# Patient Record
Sex: Female | Born: 1935 | Race: White | Hispanic: No | Smoking: Former smoker
Health system: Southern US, Community
[De-identification: ages and names within clinical notes are randomized; demographics above are authoritative.]

## PROBLEM LIST (undated history)

## (undated) DIAGNOSIS — H409 Unspecified glaucoma: Secondary | ICD-10-CM

## (undated) DIAGNOSIS — G4700 Insomnia, unspecified: Secondary | ICD-10-CM

## (undated) DIAGNOSIS — I10 Essential (primary) hypertension: Secondary | ICD-10-CM

## (undated) DIAGNOSIS — C189 Malignant neoplasm of colon, unspecified: Secondary | ICD-10-CM

---

## 2015-04-08 ENCOUNTER — Ambulatory Visit: Payer: Medicare Other | Admitting: Podiatry

## 2015-04-22 ENCOUNTER — Ambulatory Visit (INDEPENDENT_AMBULATORY_CARE_PROVIDER_SITE_OTHER): Payer: Medicare Other

## 2015-04-22 ENCOUNTER — Ambulatory Visit (INDEPENDENT_AMBULATORY_CARE_PROVIDER_SITE_OTHER): Payer: Medicare Other | Admitting: Podiatry

## 2015-04-22 ENCOUNTER — Encounter: Payer: Self-pay | Admitting: Podiatry

## 2015-04-22 VITALS — BP 135/62 | HR 74 | Resp 16 | Ht 60.0 in | Wt 100.0 lb

## 2015-04-22 DIAGNOSIS — M2042 Other hammer toe(s) (acquired), left foot: Secondary | ICD-10-CM | POA: Diagnosis not present

## 2015-04-22 NOTE — Progress Notes (Signed)
   Subjective:    Patient ID: Katie Leonard, female    DOB: 03/05/1925, 81 y.o.   MRN: [id number]  HPI Comments: "I have a problem with this toe"  Patient c/o tender 2nd toe left for several years. Worsened in the last year. The toe sits on top of the big toe. The toe is red at the knuckle. Has problems with shoes rubbing. She tries to keep taped down for comfort.  Toe Pain       Review of Systems  HENT: Positive for hearing loss.   Eyes: Positive for visual disturbance.  Neurological: Positive for dizziness.  Hematological: Bruises/bleeds easily.  All other systems reviewed and are negative.      Objective:   Physical Exam: I have reviewed her past medical history medications allergy surgery social history and review of systems. Pulses are strongly palpable bilaterally. Neurologic sensorium is intact per Semmes-Weinstein monofilament. Deep tendon reflexes are intact bilateral and muscle strength +5 over 5 dorsiflexion plantar flexors and inverters and everters all musculature is intact. Orthopedic evaluation demonstrates all joints distal to the ankle full range of motion without crepitation. Hallux valgus of the left foot with complete dislocation of the second digit of the left foot with hammertoe deformity of second metatarsophalangeal joint is also noted. This is very tender on range of motion and palpation. Radiographs confirm severe osteoarthritic changes worse metatarsophalangeal joint as well as hammertoe deformity with dislocation of the second toe at the MTPJ left.        Assessment & Plan:  Assessment: Severe hammertoe deformity with chronic pain second digit left foot.  Plan: We discussed the etiology pathology conservative versus surgical therapies. At this point we did discuss disarticulation of the second toe of the second metatarsophalangeal joint of the left foot. We went over the consent form today By lying number by number giving her him time to ask questions she saw  fit regarding this planned procedure. I answered all the questions regarding this procedure to the best of my ability in layman's terms. She understood it was amenable to it and signed all 3 pages of the consent form. We did discuss the possible postop complications which may include but are not limited to postoperative pain bleeding swelling infection recurrence of pain relief for further surgery loss of limb and loss of life.

## 2015-04-22 NOTE — Patient Instructions (Signed)
Pre-Operative Instructions  Congratulations, you have decided to take an important step to improving your quality of life.  You can be assured that the doctors of [facility] will be with you every step of the way.  1. Plan to be at the surgery center/hospital at least 1 (one) hour prior to your scheduled time unless otherwise directed by the surgical center/hospital staff.  You must have a responsible adult accompany you, remain during the surgery and drive you home.  Make sure you have directions to the surgical center/hospital and know how to get there on time. 2. For hospital based surgery you will need to obtain a history and physical form from your family physician within 1 month prior to the date of surgery- we will give you a form for you primary physician.  3. We make every effort to accommodate the date you request for surgery.  There are however, times where surgery dates or times have to be moved.  We will contact you as soon as possible if a change in schedule is required.   4. No Aspirin/Ibuprofen for one week before surgery.  If you are on aspirin, any non-steroidal anti-inflammatory medications (Mobic, Aleve, Ibuprofen) you should stop taking it 7 days prior to your surgery.  You make take Tylenol  For pain prior to surgery.  5. Medications- If you are taking daily heart and blood pressure medications, seizure, reflux, allergy, asthma, anxiety, pain or diabetes medications, make sure the surgery center/hospital is aware before the day of surgery so they may notify you which medications to take or avoid the day of surgery. 6. No food or drink after midnight the night before surgery unless directed otherwise by surgical center/hospital staff. 7. No alcoholic beverages 24 hours prior to surgery.  No smoking 24 hours prior to or 24 hours after surgery. 8. Wear loose pants or shorts- loose enough to fit over bandages, boots, and casts. 9. No slip on shoes, sneakers are best. 10. Bring  your boot with you to the surgery center/hospital.  Also bring crutches or a walker if your physician has prescribed it for you.  If you do not have this equipment, it will be provided for you after surgery. 11. If you have not been contracted by the surgery center/hospital by the day before your surgery, call to confirm the date and time of your surgery. 12. Leave-time from work may vary depending on the type of surgery you have.  Appropriate arrangements should be made prior to surgery with your employer. 13. Prescriptions will be provided immediately following surgery by your doctor.  Have these filled as soon as possible after surgery and take the medication as directed. 14. Remove nail polish on the operative foot. 15. Wash the night before surgery.  The night before surgery wash the foot and leg well with the antibacterial soap provided and water paying special attention to beneath the toenails and in between the toes.  Rinse thoroughly with water and dry well with a towel.  Perform this wash unless told not to do so by your physician.  Enclosed: 1 Ice pack (please put in freezer the night before surgery)   1 Hibiclens skin cleaner   Pre-op Instructions  If you have any questions regarding the instructions, do not hesitate to call our office.  [place]: [address] [place], [place] [address] [phone]  [place]: [address] [place], [place] [address] [phone]  [place]: [address]  [place],  [address] [phone]  Dr. Richard   Tuchman DPM, Dr. Norman Regal DPM Dr. Richard Sikora DPM, Dr. M. Todd Hyatt DPM, Dr. Kathryn Egerton DPM 

## 2015-05-16 ENCOUNTER — Telehealth: Payer: Self-pay | Admitting: *Deleted

## 2015-05-16 NOTE — Telephone Encounter (Signed)
"  I'm calling to schedule surgery with Dr. Milinda Pointer."  Have you signed consent forms?  "Yes I have.  Can I schedule for 05/23/2015?"  He doesn't have anything on the third.  "I can't do it on the 10th due to transportation issues.  How about the seventeenth?"  Yes, he can do it on the seventeenth.  "Can you possibly put me down on a waiting list for 05/23/2015 just in case there's a cancellation for that day?"  I will put you down on a waiting list.

## 2015-06-05 ENCOUNTER — Other Ambulatory Visit: Payer: Self-pay | Admitting: Podiatry

## 2015-06-05 MED ORDER — CEPHALEXIN 500 MG PO CAPS: 500.0000 mg | ORAL_CAPSULE | Freq: Three times a day (TID) | ORAL | Status: DC

## 2015-06-05 MED ORDER — HYDROCODONE-ACETAMINOPHEN 5-325 MG PO TABS: 1.0000 | ORAL_TABLET | Freq: Four times a day (QID) | ORAL | Status: DC | PRN

## 2015-06-05 MED ORDER — PROMETHAZINE HCL 25 MG PO TABS: 25.0000 mg | ORAL_TABLET | Freq: Three times a day (TID) | ORAL | Status: DC | PRN

## 2015-06-06 ENCOUNTER — Encounter: Payer: Self-pay | Admitting: Podiatry

## 2015-06-06 DIAGNOSIS — M2042 Other hammer toe(s) (acquired), left foot: Secondary | ICD-10-CM | POA: Diagnosis not present

## 2015-06-09 ENCOUNTER — Telehealth: Payer: Self-pay | Admitting: *Deleted

## 2015-06-09 NOTE — Telephone Encounter (Signed)
I called patient to see how she was doing.  "I'm doing excellent, better than what I expected.  I didn't have to take any of the pain medication.  I only took the antibiotic.  Do I still need to only be up on it 15 minutes per hour?"  That's what we recommend for at least the first week.  "Well I do cheat a little and be up on it a little more."  The reason we say that amount of time is because the more you are up on it, it has the chance of swelling and that could lead to pain.  "Oh, I don't want to do anything to do harm to my foot.  I'll make sure I follow directions."  How was your experience at the surgical center.  "I was very pleased at how everything ran.  Dr. Milinda Pointer was excellent.  I really had a good experience."  That's great, if you have any questions or concerns please give [place] a call.

## 2015-06-12 ENCOUNTER — Ambulatory Visit (INDEPENDENT_AMBULATORY_CARE_PROVIDER_SITE_OTHER): Payer: Medicare Other | Admitting: Podiatry

## 2015-06-12 ENCOUNTER — Ambulatory Visit (INDEPENDENT_AMBULATORY_CARE_PROVIDER_SITE_OTHER): Payer: Medicare Other

## 2015-06-12 ENCOUNTER — Encounter: Payer: Self-pay | Admitting: Podiatry

## 2015-06-12 VITALS — BP 123/76 | HR 74 | Resp 16

## 2015-06-12 DIAGNOSIS — Z9889 Other specified postprocedural states: Secondary | ICD-10-CM

## 2015-06-12 DIAGNOSIS — M2042 Other hammer toe(s) (acquired), left foot: Secondary | ICD-10-CM

## 2015-06-12 NOTE — Progress Notes (Signed)
She presents today 1 week status post stent amputation second toe left foot. She denies fever chills nausea vomiting muscle aches and pains that she is doing just great.  Objective: Vital signs are stable she is alert and oriented 3 dry sterile dressing intact was removed reveals disarticulation of the second toe at the level of the second metatarsophalangeal joint incision point appears to be healing quite nicely there is no erythema edema saline as drainage or odor.  Assessment: Well-healing surgical site left foot.  Plan: Redress today address her compressive dressing follow-up with her in 1 week we will consider suture removal at that time.

## 2015-06-13 NOTE — Progress Notes (Signed)
DOS 06/06/2015 Disarticulation 2nd toe left foot at MTPj.

## 2015-06-19 ENCOUNTER — Encounter: Payer: Self-pay | Admitting: Podiatry

## 2015-06-19 ENCOUNTER — Ambulatory Visit (INDEPENDENT_AMBULATORY_CARE_PROVIDER_SITE_OTHER): Payer: Medicare Other | Admitting: Podiatry

## 2015-06-19 DIAGNOSIS — Z9889 Other specified postprocedural states: Secondary | ICD-10-CM

## 2015-06-19 DIAGNOSIS — M2042 Other hammer toe(s) (acquired), left foot: Secondary | ICD-10-CM

## 2015-06-19 NOTE — Progress Notes (Signed)
She presents today 2 weeks status post stent amputation second toe left foot. She denies fever chills nausea vomiting muscle aches and pains that she is doing just great.  Objective: Vital signs are stable she is alert and oriented 3 dry sterile dressing intact was removed incision appears to be healing quite nicely there is no erythema edema drainage or odor.  Assessment: Well-healing surgical site left foot.  Plan: Sutures were removed and light redress today. She can remove the dressing in 1 week and return to regular shoe gear at that time. Will follow-up with her in 1 month.

## 2015-07-17 ENCOUNTER — Encounter: Payer: Medicare Other | Admitting: Podiatry

## 2020-12-17 ENCOUNTER — Encounter (HOSPITAL_BASED_OUTPATIENT_CLINIC_OR_DEPARTMENT_OTHER): Payer: Self-pay | Admitting: *Deleted

## 2020-12-17 ENCOUNTER — Other Ambulatory Visit: Payer: Self-pay

## 2020-12-17 ENCOUNTER — Emergency Department (HOSPITAL_BASED_OUTPATIENT_CLINIC_OR_DEPARTMENT_OTHER): Payer: Medicare Other

## 2020-12-17 DIAGNOSIS — Y92009 Unspecified place in unspecified non-institutional (private) residence as the place of occurrence of the external cause: Secondary | ICD-10-CM | POA: Diagnosis not present

## 2020-12-17 DIAGNOSIS — S0101XA Laceration without foreign body of scalp, initial encounter: Secondary | ICD-10-CM | POA: Diagnosis not present

## 2020-12-17 DIAGNOSIS — M542 Cervicalgia: Secondary | ICD-10-CM | POA: Insufficient documentation

## 2020-12-17 DIAGNOSIS — I10 Essential (primary) hypertension: Secondary | ICD-10-CM | POA: Diagnosis not present

## 2020-12-17 DIAGNOSIS — Z79899 Other long term (current) drug therapy: Secondary | ICD-10-CM | POA: Diagnosis not present

## 2020-12-17 DIAGNOSIS — S0181XA Laceration without foreign body of other part of head, initial encounter: Secondary | ICD-10-CM | POA: Insufficient documentation

## 2020-12-17 DIAGNOSIS — M545 Low back pain, unspecified: Secondary | ICD-10-CM | POA: Insufficient documentation

## 2020-12-17 DIAGNOSIS — S0993XA Unspecified injury of face, initial encounter: Secondary | ICD-10-CM | POA: Diagnosis present

## 2020-12-17 DIAGNOSIS — Z85038 Personal history of other malignant neoplasm of large intestine: Secondary | ICD-10-CM | POA: Diagnosis not present

## 2020-12-17 DIAGNOSIS — Z87891 Personal history of nicotine dependence: Secondary | ICD-10-CM | POA: Insufficient documentation

## 2020-12-17 DIAGNOSIS — S51811A Laceration without foreign body of right forearm, initial encounter: Secondary | ICD-10-CM | POA: Insufficient documentation

## 2020-12-17 DIAGNOSIS — Z7982 Long term (current) use of aspirin: Secondary | ICD-10-CM | POA: Insufficient documentation

## 2020-12-17 DIAGNOSIS — R030 Elevated blood-pressure reading, without diagnosis of hypertension: Secondary | ICD-10-CM | POA: Diagnosis not present

## 2020-12-17 DIAGNOSIS — W01198A Fall on same level from slipping, tripping and stumbling with subsequent striking against other object, initial encounter: Secondary | ICD-10-CM | POA: Diagnosis not present

## 2020-12-17 NOTE — ED Triage Notes (Signed)
C/o fall x 3 hrs ago , lac to posterior head, lac to chin, and multiple skin tears

## 2020-12-18 ENCOUNTER — Emergency Department (HOSPITAL_BASED_OUTPATIENT_CLINIC_OR_DEPARTMENT_OTHER): Payer: Medicare Other

## 2020-12-18 ENCOUNTER — Emergency Department (HOSPITAL_BASED_OUTPATIENT_CLINIC_OR_DEPARTMENT_OTHER)
Admission: EM | Admit: 2020-12-18 | Discharge: 2020-12-18 | Disposition: A | Discharge status: Home / Self Care | Payer: Medicare Other | Attending: Emergency Medicine | Admitting: Emergency Medicine

## 2020-12-18 DIAGNOSIS — S0181XA Laceration without foreign body of other part of head, initial encounter: Secondary | ICD-10-CM | POA: Diagnosis not present

## 2020-12-18 DIAGNOSIS — I10 Essential (primary) hypertension: Secondary | ICD-10-CM

## 2020-12-18 DIAGNOSIS — S51811A Laceration without foreign body of right forearm, initial encounter: Secondary | ICD-10-CM

## 2020-12-18 DIAGNOSIS — S0101XA Laceration without foreign body of scalp, initial encounter: Secondary | ICD-10-CM

## 2020-12-18 DIAGNOSIS — W19XXXA Unspecified fall, initial encounter: Secondary | ICD-10-CM

## 2020-12-18 HISTORY — DX: Unspecified glaucoma: H40.9

## 2020-12-18 HISTORY — DX: Insomnia, unspecified: G47.00

## 2020-12-18 HISTORY — DX: Malignant neoplasm of colon, unspecified: C18.9

## 2020-12-18 HISTORY — DX: Essential (primary) hypertension: I10

## 2020-12-18 MED ORDER — LIDOCAINE-EPINEPHRINE (PF) 2 %-1:200000 IJ SOLN
20.0000 mL | Freq: Once | INTRAMUSCULAR | Status: AC
  Administered 2020-12-18: 20 mL

## 2020-12-18 NOTE — ED Provider Notes (Signed)
[facility] EMERGENCY DEPARTMENT Provider Note   CSN: [id number] Arrival date & time: 12/17/20  2257   History Chief Complaint  Patient presents with  . Fall    Katie Leonard is a 86 y.o. female.  The history is provided by the patient.  Fall  She has history of hypertension and apparently tripped at home and fell causing a laceration to her occiput.  She also had a surgical incision on her chin on the left side which never healed properly and has opened up.  She suffered a skin tear to her right forearm.  She is also complaining of pain in her lower back and in her neck.  She denies any weakness, numbness, tingling.  She states that she is up-to-date on tetanus immunizations.  Past Medical History:  Diagnosis Date  . Colon cancer (HCC)   . Glaucoma   . Hypertension   . Insomnia     There are no problems to display for this patient.   History reviewed. No pertinent surgical history.   OB History   No obstetric history on file.     No family history on file.  Social History   Tobacco Use  . Smoking status: Former Smoker    Quit date: 04/21/1978    Years since quitting: 42.6    Home Medications Prior to Admission medications   Medication Sig Start Date End Date Taking? Authorizing Provider  alendronate (FOSAMAX) 70 MG tablet Take 70 mg by mouth once a week. Take with a full glass of water on an empty stomach.    [provider]  amLODipine (NORVASC) 5 MG tablet Take 5 mg by mouth daily.    [provider]  aspirin 81 MG tablet Take 81 mg by mouth daily.    [provider]  Calcium Carbonate (CALTRATE 600 PO) Take by mouth.    [provider]  Cholecalciferol (VITAMIN D PO) Take by mouth.    [provider]  cycloSPORINE (RESTASIS) 0.05 % ophthalmic emulsion 1 drop 2 (two) times daily.    [provider]  Multiple Vitamin (MULTIVITAMIN) capsule Take 1 capsule by mouth daily.    [provider]   Multiple Vitamins-Minerals (PRESERVISION AREDS 2 PO) Take by mouth.    [provider]  promethazine (PHENERGAN) 25 MG tablet Take 1 tablet (25 mg total) by mouth every 8 (eight) hours as needed for nausea or vomiting. 06/05/15   Hyatt, Max T, DPM  ramipril (ALTACE) 5 MG capsule Take 5 mg by mouth daily.    [provider]  travoprost, benzalkonium, (TRAVATAN) 0.004 % ophthalmic solution 1 drop at bedtime.    [provider]  zolpidem (AMBIEN) 10 MG tablet Take 10 mg by mouth at bedtime as needed for sleep.    [provider]    Allergies    Erythromycin  Review of Systems   Review of Systems  All other systems reviewed and are negative.   Physical Exam Updated Vital Signs BP (!) 192/88   Pulse 84   Temp 98 F (36.7 C)   Resp 17   Ht 5' (1.524 m)   Wt 40.8 kg   SpO2 97%   BMI 17.58 kg/m   Physical Exam Vitals and nursing note reviewed.   86 year old female, resting comfortably and in no acute distress. Vital signs are significant for elevated blood pressure. Oxygen saturation is 97%, which is normal. Head is normocephalic: Laceration is present on the occiput and on  the left side of the chin. PERRLA, EOMI. Oropharynx is clear. Neck is mildly tender in the midline.  There is no adenopathy or JVD. Back is mildly tender in the mid and upper lumbar area.  Moderate kyphoscoliosis is present.  There is no CVA tenderness. Lungs are clear without rales, wheezes, or rhonchi. Chest is nontender. Heart has regular rate and rhythm without murmur. Abdomen is soft, flat, nontender without masses or hepatosplenomegaly and peristalsis is normoactive. Extremities have no cyanosis or edema, full range of motion is present.  Skin tear noted dorsum of right forearm. Skin is warm and dry without rash. Neurologic: Mental status is normal, cranial nerves are intact, there are no motor or sensory deficits.   ED Results / Procedures / Treatments   Labs (all  labs ordered are listed, but only abnormal results are displayed) Labs Reviewed - No data to display  EKG None  Radiology CT Head Wo Contrast  Result Date: 12/17/2020 CLINICAL DATA:  86 year old post fall with laceration to posterior head. EXAM: CT HEAD WITHOUT CONTRAST TECHNIQUE: Contiguous axial images were obtained from the base of the skull through the vertex without intravenous contrast. COMPARISON:  None. FINDINGS: Brain: Age related atrophy with mild chronic small vessel ischemia. No intracranial hemorrhage, mass effect, or midline shift. No hydrocephalus. The basilar cisterns are patent. No evidence of territorial infarct or acute ischemia. No extra-axial or intracranial fluid collection. Vascular: No hyperdense vessel. Skull: No fracture or focal lesion. Sinuses/Orbits: Complete opacification of the right maxillary sinus with cortical thickening. Remaining paranasal sinuses are clear. Other: Right parietal scalp hematoma and laceration. IMPRESSION: 1. Right parietal scalp hematoma and laceration. No acute intracranial abnormality. No skull fracture. 2. Age related atrophy and chronic small vessel ischemia. 3. Chronic right maxillary sinusitis. Electronically Signed   By: Keith Rake M.D.   On: 12/17/2020 23:38    Procedures .Marland KitchenLaceration Repair  Date/Time: 12/18/2020 5:24 AM Performed by: Delora Fuel, MD Authorized by: Delora Fuel, MD   Consent:    Consent obtained:  Verbal   Consent given by:  Patient   Risks discussed:  Pain and poor wound healing   Alternatives discussed:  No treatment Universal protocol:    Procedure explained and questions answered to patient or proxy's satisfaction: yes     Relevant documents present and verified: yes     Test results available: yes     Imaging studies available: yes     Required blood products, implants, devices, and special equipment available: yes     Site/side marked: yes     Immediately prior to procedure, a time out was  called: yes     Patient identity confirmed:  Verbally with patient and arm band Anesthesia:    Anesthesia method:  Local infiltration   Local anesthetic:  Lidocaine 2% WITH epi Laceration details:    Location:  Scalp   Scalp location:  Occipital   Length (cm):  3   Depth (mm):  2 Pre-procedure details:    Preparation:  Patient was prepped and draped in usual sterile fashion and imaging obtained to evaluate for foreign bodies Exploration:    Limited defect created (wound extended): no     Hemostasis achieved with:  Direct pressure   Wound extent: no foreign bodies/material noted     Contaminated: no   Treatment:    Area cleansed with:  Saline   Amount of cleaning:  Standard   Debridement:  None   Undermining:  None   Scar revision: no  Skin repair:    Repair method:  Staples   Number of staples:  4 Approximation:    Approximation:  Close Repair type:    Repair type:  Simple Post-procedure details:    Dressing:  Open (no dressing)   Procedure completion:  Tolerated well, no immediate complications .Marland KitchenLaceration Repair  Date/Time: 12/18/2020 5:25 AM Performed by: Delora Fuel, MD Authorized by: Delora Fuel, MD   Consent:    Risks discussed:  Infection and poor wound healing   Alternatives discussed:  No treatment Universal protocol:    Procedure explained and questions answered to patient or proxy's satisfaction: yes     Relevant documents present and verified: yes     Required blood products, implants, devices, and special equipment available: yes     Site/side marked: yes     Immediately prior to procedure, a time out was called: yes     Patient identity confirmed:  Verbally with patient and arm band Anesthesia:    Anesthesia method:  None Laceration details:    Location:  Shoulder/arm   Shoulder/arm location:  R lower arm   Length (cm):  5   Depth (mm):  1 Pre-procedure details:    Preparation:  Patient was prepped and draped in usual sterile  fashion Exploration:    Limited defect created (wound extended): no     Hemostasis achieved with:  Direct pressure   Contaminated: no   Treatment:    Area cleansed with:  Saline   Amount of cleaning:  Standard   Visualized foreign bodies/material removed: no     Debridement:  None   Undermining:  None   Scar revision: no   Skin repair:    Repair method:  Tissue adhesive Approximation:    Approximation:  Close Repair type:    Repair type:  Simple Post-procedure details:    Dressing:  Open (no dressing)   Procedure completion:  Tolerated well, no immediate complications .Marland KitchenLaceration Repair  Date/Time: 12/18/2020 5:27 AM Performed by: Delora Fuel, MD Authorized by: Delora Fuel, MD   Consent:    Consent obtained:  Verbal   Consent given by:  Patient   Risks discussed:  Infection, poor cosmetic result and poor wound healing   Alternatives discussed:  No treatment Universal protocol:    Procedure explained and questions answered to patient or proxy's satisfaction: yes     Relevant documents present and verified: yes     Test results available: yes     Imaging studies available: yes     Required blood products, implants, devices, and special equipment available: yes     Site/side marked: yes     Immediately prior to procedure, a time out was called: yes     Patient identity confirmed:  Verbally with patient and arm band Anesthesia:    Anesthesia method:  Local infiltration   Local anesthetic:  Lidocaine 2% WITH epi Laceration details:    Location:  Face   Face location:  Chin   Length (cm):  5   Depth (mm):  4 Pre-procedure details:    Preparation:  Patient was prepped and draped in usual sterile fashion Exploration:    Limited defect created (wound extended): no     Hemostasis achieved with:  Direct pressure   Wound exploration: entire depth of wound visualized     Wound extent: no foreign bodies/material noted     Contaminated: no   Treatment:    Area cleansed  with:  Saline   Amount of cleaning:  Standard   Debridement:  None   Undermining:  None   Scar revision: no   Skin repair:    Repair method:  Sutures   Suture size:  5-0   Suture material:  Prolene   Number of sutures:  8 Approximation:    Approximation:  Close Repair type:    Repair type:  Simple Post-procedure details:    Dressing:  Antibiotic ointment and sterile dressing   Procedure completion:  Tolerated well, no immediate complications    Medications Ordered in ED Medications  lidocaine-EPINEPHrine (XYLOCAINE W/EPI) 2 %-1:200000 (PF) injection 20 mL (has no administration in time range)    ED Course  I have reviewed the triage vital signs and the nursing notes.  Pertinent imaging results that were available during my care of the patient were reviewed by me and considered in my medical decision making (see chart for details).  MDM Rules/Calculators/A&P Fall at home with laceration of chin, occiput and skin tear right forearm.  CT of head was ordered at triage and shows no intracranial injury.  Will send back for CT of cervical spine and of lumbar spine.  Old records are reviewed, and she has no relevant past visits.  Scans show no evidence of acute fracture.  Skin tear is closed with tissue adhesive, scalp laceration closed with staples, chin laceration closed with sutures.  Because this is a reopening of a prior wound, I am recommending that sutures be left in for 10-14days.  Risk of scarring from pressure of sutures was explained to the patient as well as rationale for delaying suture removal.  Advised to apply ice and use over-the-counter analgesics as needed for pain.  Final Clinical Impression(s) / ED Diagnoses Final diagnoses:  Fall in home, initial encounter  Laceration of occipital scalp, initial encounter  Laceration of chin, initial encounter  Skin tear of right forearm without complication, initial encounter  Elevated blood pressure reading with diagnosis of  hypertension    Rx / DC Orders ED Discharge Orders    None       Delora Fuel, MD [id number] [phone]

## 2020-12-18 NOTE — ED Notes (Signed)
Cleansed back of head (soaked with sterile water). Applied nonadherent dressing to right forearm abrasion (dermabond previously applied. Encouraged pt to keep right side chin sutures clean/dry/intact. Pt A&Ox4, verbalized understanding.

## 2020-12-18 NOTE — Discharge Instructions (Signed)
Apply ice to sore areas.  You should put it on for 30 minutes at a time, 4 times a day.  You may take ibuprofen and/or acetaminophen as needed for pain.

## 2020-12-18 NOTE — ED Notes (Signed)
Patient transported to X-ray 

## 2022-01-01 IMAGING — CT CT CERVICAL SPINE W/O CM
2 of 3 series · 10 of 27 positions shown, 13 images · non-contrast
Comparison: Head CT 12/17/2020.

CLINICAL DATA: [AGE] female status post fall tonight.
Lacerations, skin tears.

EXAM:
CT CERVICAL SPINE WITHOUT CONTRAST
TECHNIQUE: Multidetector CT imaging of the cervical spine was performed without
intravenous contrast. Multiplanar CT image reconstructions were also
generated.

[Series 5: sagittal bone · sagittal · 0.23mm/px · 5 of 50 slices shown, 6 images]
[im 17/50  bone]
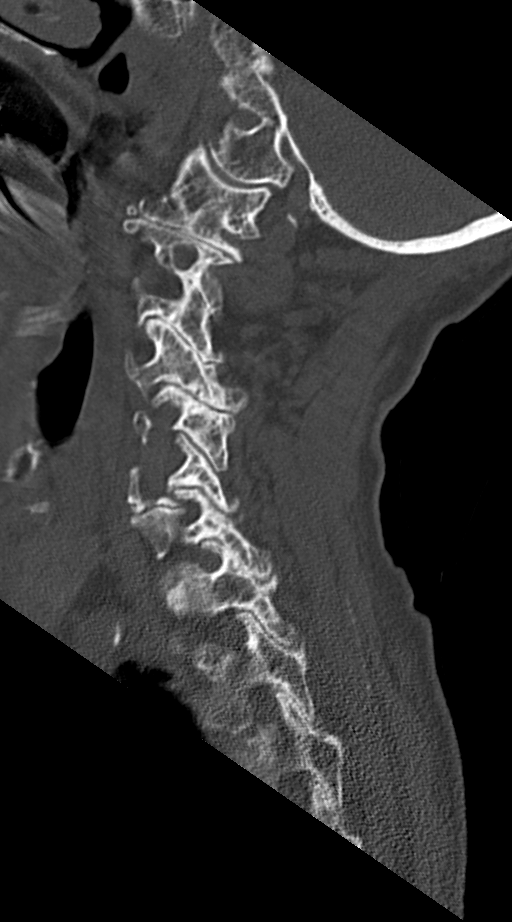
[im 21/50  bone]
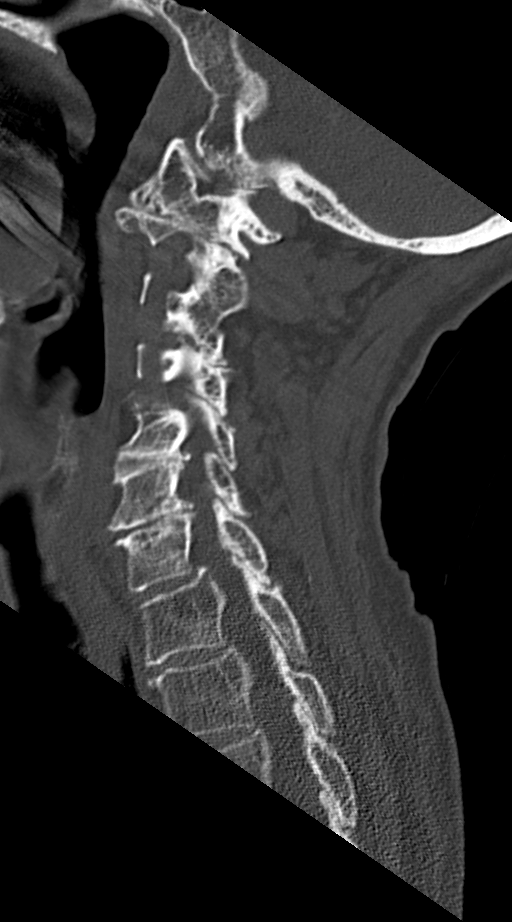
[im 25/50  soft-tissue]
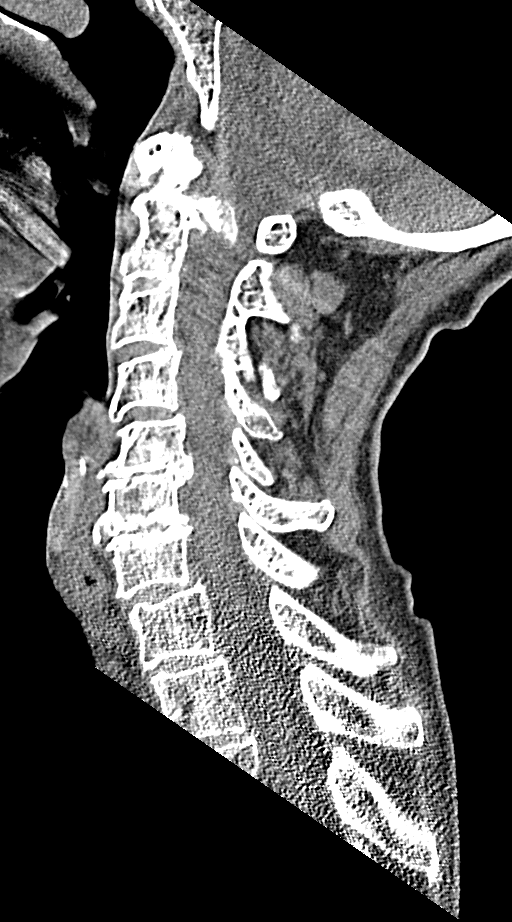
[im 25/50  bone]
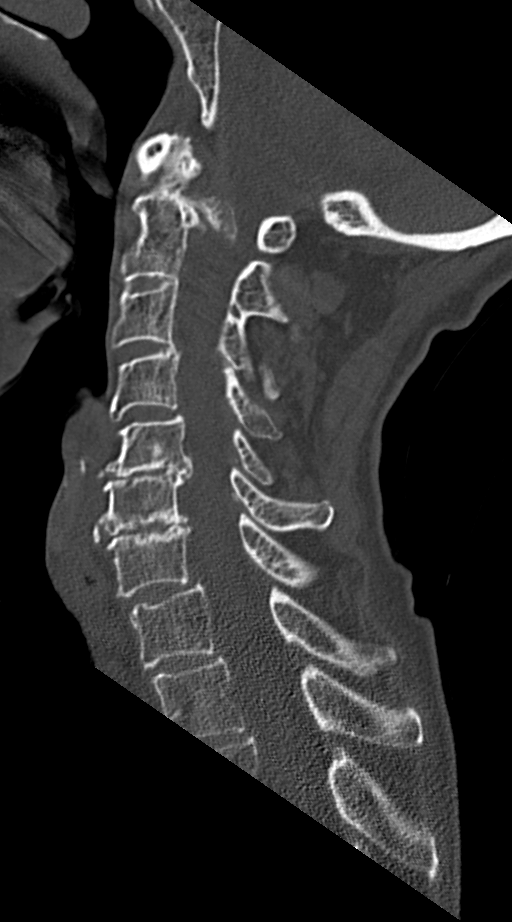
[im 29/50  bone]
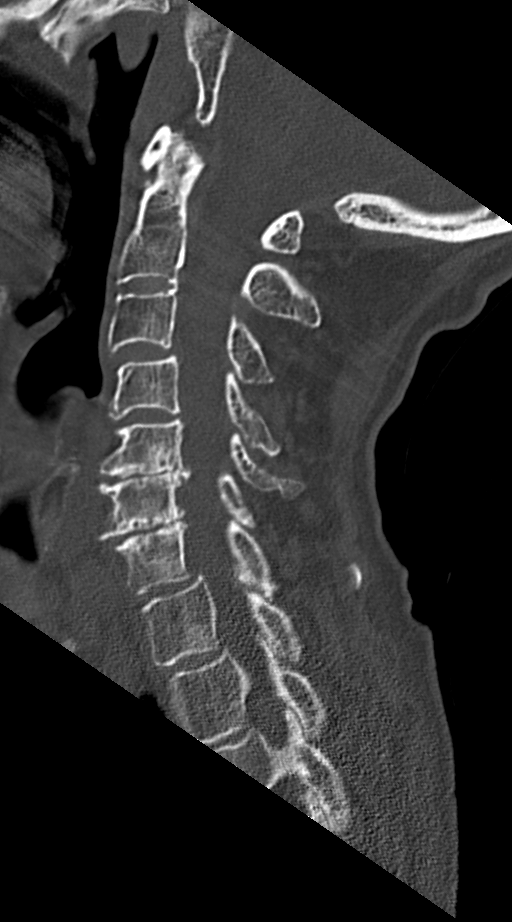
[im 33/50  bone]
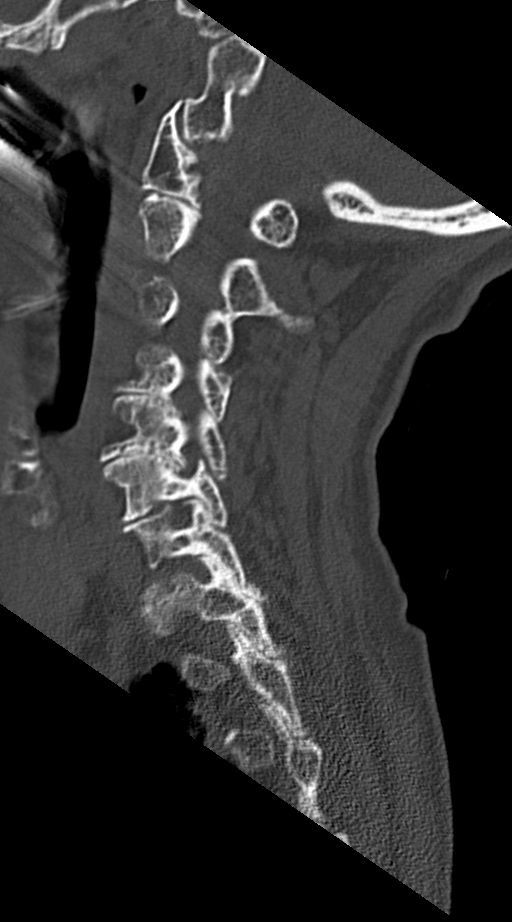

[Series 7: orthogonal bone · axial · 0.21mm/px · z∈[-222,-89]mm · 5 of 109 slices shown, 7 images]
[im 14/109  soft-tissue]
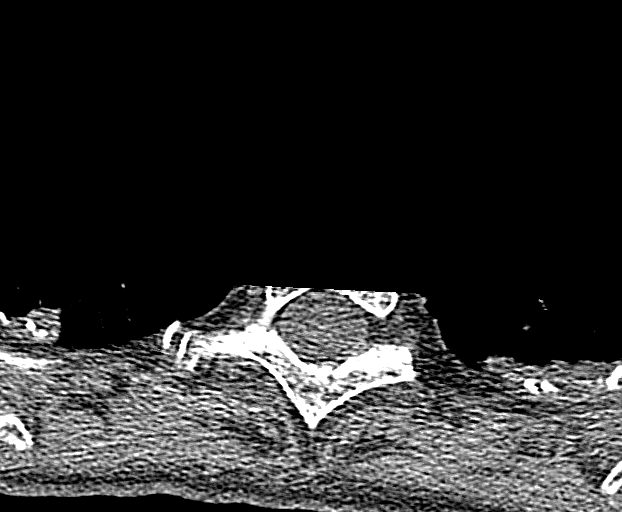
[im 14/109  bone]
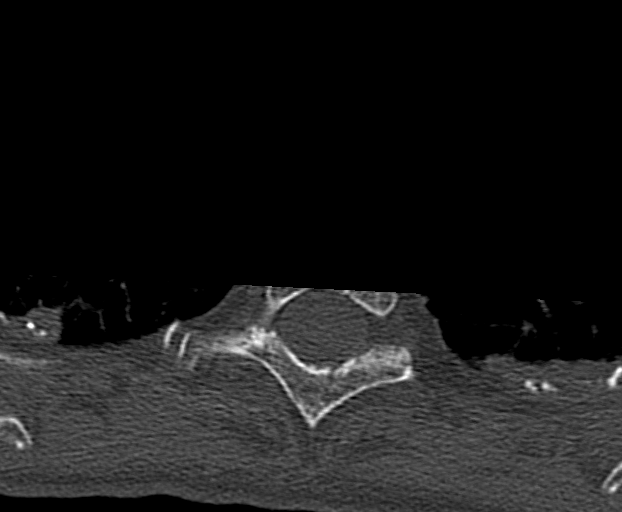
[im 41/109  bone]
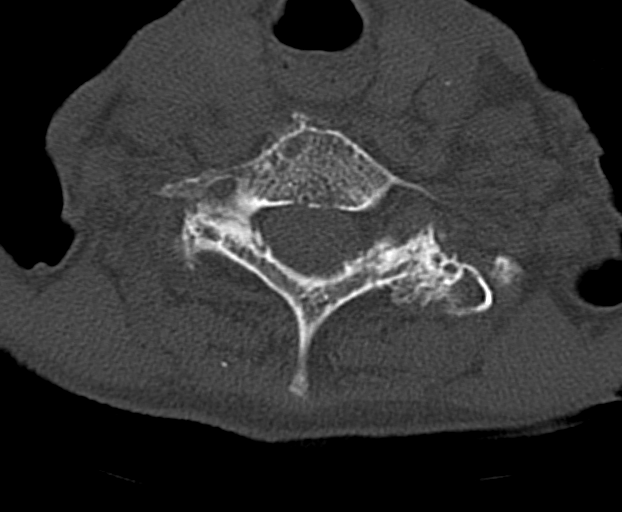
[im 55/109  bone]
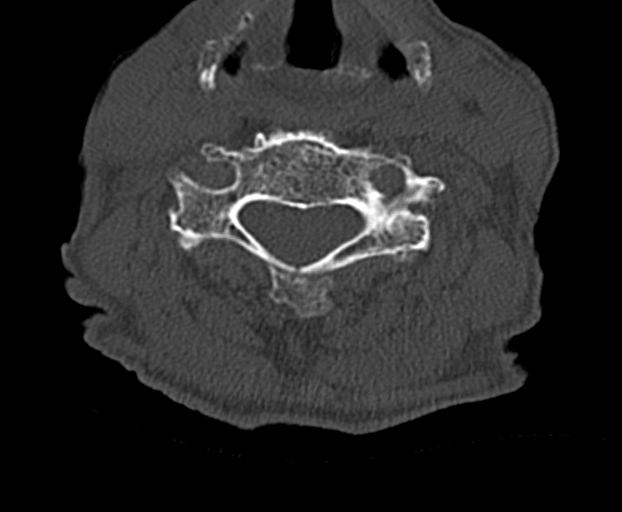
[im 68/109  bone]
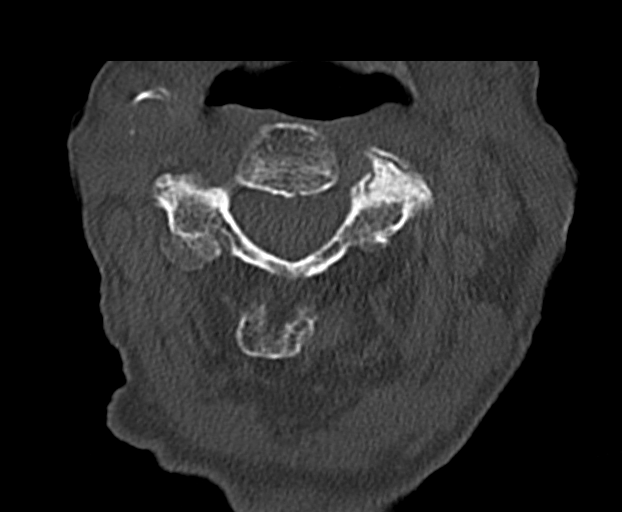
[im 95/109  soft-tissue]
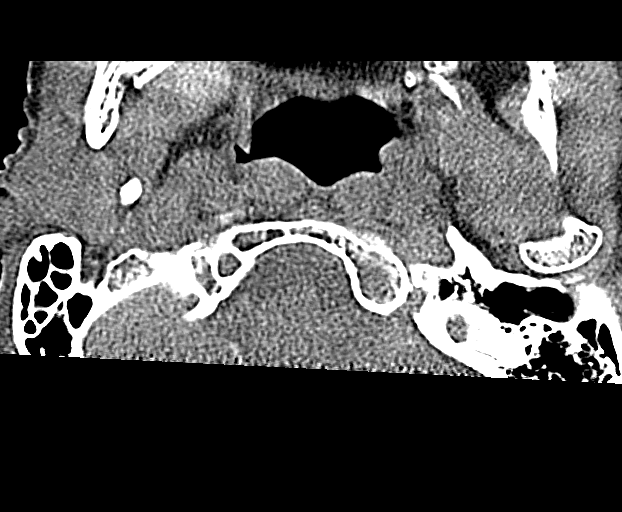
[im 95/109  bone]
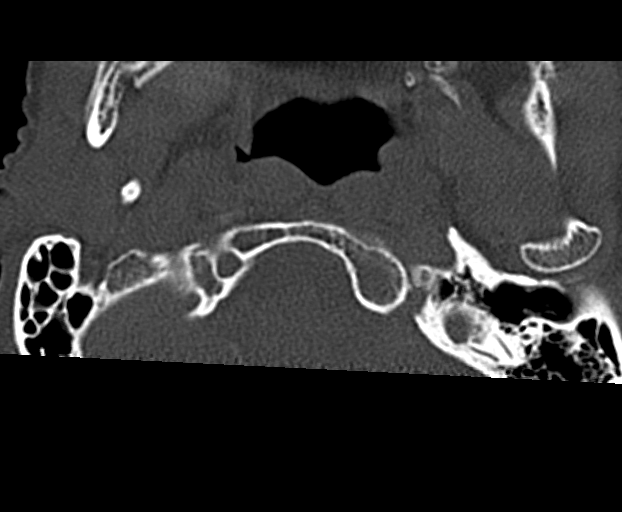

[10 of 27 positions shown; findings below may reference images not displayed]

FINDINGS: Alignment: Mild straightening of cervical lordosis with multilevel
mild degenerative appearing spondylolisthesis: Anterolisthesis of C3
on C4 and C4 on C5. Mild retrolisthesis C5 on C6. Anterolisthesis C7
on T1. Bilateral posterior element alignment is maintained.

Skull base and vertebrae: Visualized skull base is intact. No
atlanto-occipital dissociation. C1-C2 alignment preserved.
Osteopenia. No acute osseous abnormality identified.

Soft tissues and spinal canal: No prevertebral fluid or swelling. No
visible canal hematoma. Bulky calcified left carotid bifurcation
atherosclerosis.

Disc levels: Ankylosed C2-C3 level. Chronic very severe left lateral
C1-C2 joint degeneration with bulky osteophytosis.

Widespread bulky facet arthropathy associated with multilevel mild
degenerative appearing spondylolisthesis throughout the cervical
spine.

Severe chronic disc and endplate degeneration superimposed at C5-C6
and C6-C7.

However, only mild degenerative cervical spinal stenosis is
suspected.

Upper chest: Mild age indeterminate superior endplate compression
fracture of T1 (sagittal image 26). Otherwise the visible upper
thoracic levels appear intact.

Partially calcified confluent biapical lung scarring.

Other: Negative visible noncontrast posterior fossa.
IMPRESSION: 1. Age indeterminate mild T1 superior endplate compression fracture.
2. No acute traumatic injury identified in the cervical spine.
3. Ankylosed C2-C3 level with advanced cervical spine degeneration
elsewhere.

## 2023-11-09 NOTE — Progress Notes (Signed)
 Subjective   Patient ID:  Katie Leonard is a 89 y.o. (DOB 20-Jul-1925) female    Patient presents with  . Medicare Wellness Visit     HPI  Katie Leonard is here today for her Medicare wellness visit and routine physical.  Since her original appointment today she stopped her aspirin and metoprolol.  The first few days she felt her dizziness was better however now it is back and still pretty consistent.  In reviewing her records she is dealt with dizziness for many many years.  We will try a short course of low-dose meclizine  to see if we can help this.  Her caregiver does report that she gets extremely dizzy after hot showers.  We discussed watching temperature control and autonomic changes.  She would like a refill on her Ambien .  She was previously prescribed 10 mg and breaking this into 4 pieces.  We will lower the dose to 5 mg and she can break these in half or even still into fourths if needed. She has had her flu shot at her living facility.   She is underweight.  She currently is taking Fosamax every week and has been for about 10 years.  We are going to stop that.  Has an old prescription for pravastatin, we are going to remove that if she is not taking it.   Reviewed and updated this visit by provider: PDMP        Review of Systems  Constitutional: Negative.   HENT: Negative.    Respiratory: Negative.    Cardiovascular: Negative.   Gastrointestinal: Negative.   Musculoskeletal:  Positive for arthralgias and myalgias.  Neurological:  Positive for dizziness.  Psychiatric/Behavioral: Negative.       Objective   Vitals:   11/09/23 1017  BP: 120/78  Patient Position: Sitting  Pulse: 78  Temp: 97.6 F (36.4 C)  TempSrc: Temporal  Resp: 16  Height: 5' (1.524 m)  Weight: 87 lb (39.5 kg)  SpO2: 99%  BMI (Calculated): 17  PainSc: 0-No pain     Physical Exam Constitutional:      Appearance: Normal appearance.     Comments: underweight  HENT:     Head: Normocephalic.      Right Ear: Tympanic membrane, ear canal and external ear normal.     Left Ear: Tympanic membrane, ear canal and external ear normal.     Nose: Nose normal.     Mouth/Throat:     Mouth: Mucous membranes are moist.     Pharynx: Oropharynx is clear.  Eyes:     Extraocular Movements: Extraocular movements intact.     Conjunctiva/sclera: Conjunctivae normal.     Pupils: Pupils are equal, round, and reactive to light.  Cardiovascular:     Rate and Rhythm: Normal rate and regular rhythm.     Pulses: Normal pulses.  Musculoskeletal:     Cervical back: Normal range of motion.     Comments: Walker with ambulation   Pulmonary:     Effort: Pulmonary effort is normal.     Breath sounds: Normal breath sounds.  Abdominal:     General: Abdomen is flat. Bowel sounds are normal.     Palpations: Abdomen is soft.     Tenderness: There is no abdominal tenderness.  Skin:    General: Skin is warm and dry.     Capillary Refill: Capillary refill takes less than 2 seconds.  Neurological:     General: No focal deficit present.     Mental  Status: She is alert and oriented to person, place, and time. Mental status is at baseline.  Psychiatric:        Mood and Affect: Mood normal.        Behavior: Behavior normal.        Thought Content: Thought content normal.        Judgment: Judgment normal.        Assessment and Plan  Katie Leonard was seen today for medicare wellness visit. 1. Medicare annual wellness visit, subsequent (Primary) 2. Annual physical exam 3. Mixed hyperlipidemia -     Lipid Panel With LDL/HDL Ratio; Future -     CBC And Differential; Future -     Comprehensive Metabolic Panel; Future 4. Primary hypertension -     CBC And Differential; Future -     Comprehensive Metabolic Panel; Future 5. Vitamin D insufficiency -     Vitamin D 25 Hydroxy; Future 6. Other insomnia -     zolpidem  (AMBIEN ) 5 MG tablet; Take 1/2 tablet to whole tablet at bedtime as needed for sleep, Normal 7. Underweight  (BMI < 18.5) 8. Dizziness -     meclizine  HCl (ANTIVERT ) 25 mg tablet; Take 1/2 to whole tablet three times a day as needed for dizziness, Normal    Labs today, follow up with results Try short course of meclizine  for dizziness, be mindful of positional changes Stop metoprolol and fosamax (not sure if she is taking this, bottle from 2017)  Follow up in about 4 months (around 03/08/2024) for medication recheck.     - Health maintenance issues including appropriate cancer screening, healthy diet, exercise and tobacco avoidance were discussed with the patient.  I've encouraged healthy lifestyle modifications of eating fruits/vegetables, decreased fat intake, regular daily exercise, and decrease stress.  - Risks, benefits, and alternatives of the medications and treatment plan prescribed today were discussed, and patient expressed understanding.  - Labs ordered today:  We will call pt with results. - Discussed exercising 30 minutes at least 5 days per week and eating healthy, consistent of fruits, vegetables, and lean meats. - Follow up in about 1 year for annual physical exam, Chronic medical problems. - Return to clinic to be reevaluated if symptoms worsen, persist, change, or if you have any other concerns. - I discussed this diagnosis with the patient and discussed the treatment plan with them. This treatment plan is also outlined in the Patient Instructions and a copy of this was provided to the patient.   Patient's Medications  New Prescriptions   MECLIZINE  HCL (ANTIVERT ) 25 MG TABLET    Take 1/2 to whole tablet three times a day as needed for dizziness   ZOLPIDEM  (AMBIEN ) 5 MG TABLET    Take 1/2 tablet to whole tablet at bedtime as needed for sleep  Previous Medications   CALCIUM CARBONATE-VITAMIN D 600-400 MG-UNIT PER CHEW TABLET    Chew one tablet by mouth 2 (two) times daily. Takes one every other day   CLOBETASOL PROPIONATE (TEMOVATE) 0.05% CREAM    Uses as needed   DESOXIMETASONE  (TOPICORT) 0.25% CREAM    APPLY TOPICALLY TWICE DAILY   IPRATROPIUM (ATROVENT ) 0.03% NASAL SPRAY    two sprays by Nasal route 2 (two) times daily.   MULTIPLE VITAMINS-MINERALS (PRESERVISION AREDS PO)    Take by mouth.   NITROGLYCERIN (NITROSTAT) 0.3 MG SL TABLET    Place one tablet (0.3 mg dose) under the tongue every 5 (five) minutes as needed for Chest pain.  TIMOLOL  (BETIMOL ) 0.25% OPHTHALMIC SOLUTION    Place one drop to two drops into both eyes 2 (two) times daily.   TRAVOPROST , BENZALKONIUM, (TRAVATAN ) 0.004% OPHTHALMIC SOLUTION    Apply one drop to eye at bedtime.   TRIAMCINOLONE (KENALOG) 0.025% OINTMENT    Apply topically 2 (two) times daily.  Modified Medications   No medications on file  Discontinued Medications   ZOLPIDEM  TARTRATE (AMBIEN ) 10 MG TABLET    TAKE 1/4 TABLET BY MOUTH AT BEDTIME AS NEEDED      Risks, benefits, and alternatives of the medications and treatment plan prescribed today were discussed, and patient expressed understanding. Plan follow-up as discussed or as needed if any worsening symptoms or change in condition.   A yearly preventative health exam was recommended and current age based recommendations were discussed.  I have reviewed the information contained in this note and personally verified its accuracy.  MDM billing - I personally developed the plan of care based on documented medical decision making. Charmaine Heller, NP

## 2023-11-22 ENCOUNTER — Emergency Department (HOSPITAL_COMMUNITY): Payer: Medicare Other

## 2023-11-22 ENCOUNTER — Emergency Department (HOSPITAL_COMMUNITY)
Admission: EM | Admit: 2023-11-22 | Discharge: 2023-11-23 | Disposition: A | Discharge status: Home / Self Care | Payer: Medicare Other | Attending: Emergency Medicine | Admitting: Emergency Medicine

## 2023-11-22 ENCOUNTER — Encounter (HOSPITAL_COMMUNITY): Payer: Self-pay

## 2023-11-22 ENCOUNTER — Other Ambulatory Visit: Payer: Self-pay

## 2023-11-22 DIAGNOSIS — R296 Repeated falls: Secondary | ICD-10-CM | POA: Diagnosis not present

## 2023-11-22 DIAGNOSIS — Z7982 Long term (current) use of aspirin: Secondary | ICD-10-CM | POA: Diagnosis not present

## 2023-11-22 DIAGNOSIS — W19XXXA Unspecified fall, initial encounter: Secondary | ICD-10-CM | POA: Diagnosis not present

## 2023-11-22 DIAGNOSIS — S5012XA Contusion of left forearm, initial encounter: Secondary | ICD-10-CM | POA: Insufficient documentation

## 2023-11-22 DIAGNOSIS — R42 Dizziness and giddiness: Secondary | ICD-10-CM | POA: Diagnosis present

## 2023-11-22 DIAGNOSIS — Z79899 Other long term (current) drug therapy: Secondary | ICD-10-CM | POA: Insufficient documentation

## 2023-11-22 LAB — COMPREHENSIVE METABOLIC PANEL
ALT: 18 U/L (ref 0–44)
AST: 24 U/L (ref 15–41)
Albumin: 3.5 g/dL (ref 3.5–5.0)
Alkaline Phosphatase: 89 U/L (ref 38–126)
Anion gap: 7 (ref 5–15)
BUN: 15 mg/dL (ref 8–23)
CO2: 25 mmol/L (ref 22–32)
Calcium: 8.7 mg/dL — ABNORMAL LOW (ref 8.9–10.3)
Chloride: 101 mmol/L (ref 98–111)
Creatinine, Ser: 0.49 mg/dL (ref 0.44–1.00)
GFR, Estimated: >60 mL/min (ref >60)
Glucose, Bld: 86 mg/dL (ref 70–99)
Potassium: 3.6 mmol/L (ref 3.5–5.1)
Sodium: 133 mmol/L — ABNORMAL LOW (ref 135–145)
Total Bilirubin: 0.7 mg/dL (ref <1.2)
Total Protein: 6.8 g/dL (ref 6.5–8.1)

## 2023-11-22 LAB — CBC WITH DIFFERENTIAL/PLATELET
Abs Immature Granulocytes: 0.03 10*3/uL (ref 0.00–0.07)
Basophils Absolute: 0.1 10*3/uL (ref 0.0–0.1)
Basophils Relative: 1 %
Eosinophils Absolute: 0.1 10*3/uL (ref 0.0–0.5)
Eosinophils Relative: 1 %
HCT: 40.7 % (ref 36.0–46.0)
Hemoglobin: 13 g/dL (ref 12.0–15.0)
Immature Granulocytes: 1 %
Lymphocytes Relative: 17 %
Lymphs Abs: 1.1 10*3/uL (ref 0.7–4.0)
MCH: 32.7 pg (ref 26.0–34.0)
MCHC: 31.9 g/dL (ref 30.0–36.0)
MCV: 102.3 fL — ABNORMAL HIGH (ref 80.0–100.0)
Monocytes Absolute: 0.7 10*3/uL (ref 0.1–1.0)
Monocytes Relative: 11 %
Neutro Abs: 4.5 10*3/uL (ref 1.7–7.7)
Neutrophils Relative %: 69 %
Platelets: 257 10*3/uL (ref 150–400)
RBC: 3.98 MIL/uL (ref 3.87–5.11)
RDW: 13.2 % (ref 11.5–15.5)
WBC: 6.5 10*3/uL (ref 4.0–10.5)
nRBC: 0 % (ref 0.0–0.2)

## 2023-11-22 LAB — URINALYSIS, W/ REFLEX TO CULTURE (INFECTION SUSPECTED)
Bacteria, UA: NONE SEEN
Bilirubin Urine: NEGATIVE
Glucose, UA: NEGATIVE mg/dL
Hgb urine dipstick: NEGATIVE
Ketones, ur: 20 mg/dL — AB
Nitrite: NEGATIVE
Protein, ur: NEGATIVE mg/dL
Specific Gravity, Urine: 1.011 (ref 1.005–1.030)
pH: 6 (ref 5.0–8.0)

## 2023-11-22 LAB — CK: Total CK: 148 U/L (ref 38–234)

## 2023-11-22 MED ORDER — TIMOLOL HEMIHYDRATE 0.25 % OP SOLN: 1.0000 [drp] | Freq: Two times a day (BID) | OPHTHALMIC | Status: DC

## 2023-11-22 MED ORDER — ZOLPIDEM TARTRATE 5 MG PO TABS: 2.5000 mg | ORAL_TABLET | Freq: Every evening | ORAL | Status: DC | PRN

## 2023-11-22 MED ORDER — MECLIZINE HCL 12.5 MG PO TABS: 6.2500 mg | ORAL_TABLET | Freq: Three times a day (TID) | ORAL | Status: DC | PRN

## 2023-11-22 MED ORDER — ACETAMINOPHEN 325 MG PO TABS
650.0000 mg | ORAL_TABLET | Freq: Four times a day (QID) | ORAL | Status: DC | PRN
  Administered 2023-11-22: 650 mg via ORAL
  Filled 2023-11-22: qty 2

## 2023-11-22 MED ORDER — TRAVOPROST 0.004 % OP SOLN: 1.0000 [drp] | Freq: Every day | OPHTHALMIC | Status: DC

## 2023-11-22 MED ORDER — IPRATROPIUM BROMIDE 0.03 % NA SOLN: 2.0000 | Freq: Two times a day (BID) | NASAL | Status: DC

## 2023-11-22 MED ORDER — AZELASTINE HCL 0.1 % NA SOLN: 1.0000 | Freq: Two times a day (BID) | NASAL | Status: DC

## 2023-11-22 NOTE — ED Provider Notes (Signed)
[place] EMERGENCY DEPARTMENT AT [facility] Provider Note   CSN: [id number] Arrival date & time: 11/22/23  1407     History  Chief Complaint  Patient presents with   Katie Leonard is a 89 y.o. female.  Patient is a 89 year old female with past medical history of hypertension and chronic dizziness presenting to the emergency department with frequent falls.  Patient states that she has had occasional falls in the past however over the last few weeks she has had multiple falls.  She states her last fall was last night when she was trying to unplug her Christmas tree and hit her back and states that she was unable to get herself up off the floor until her caregiver came to help her this morning.  She states that she does occasionally still get dizziness but does not always feel dizzy prior to her falls.  She denies significant weakness or any numbness.  She states she has not been having any headaches, nausea, vomiting or diarrhea.  She denies any fevers, dysuria or hematuria.  She states that she is having neck pain and left-sided mid back pain since her fall.  The history is provided by the patient.  Fall       Home Medications Prior to Admission medications   Medication Sig Start Date End Date Taking? Authorizing Provider  alendronate (FOSAMAX) 70 MG tablet Take 70 mg by mouth once a week. Take with a full glass of water on an empty stomach.    [provider]  amLODipine (NORVASC) 5 MG tablet Take 5 mg by mouth daily.    [provider]  aspirin 81 MG tablet Take 81 mg by mouth daily.    [provider]  Calcium Carbonate (CALTRATE 600 PO) Take by mouth.    [provider]  Cholecalciferol (VITAMIN D PO) Take by mouth.    [provider]  cycloSPORINE (RESTASIS) 0.05 % ophthalmic emulsion 1 drop 2 (two) times daily.    [provider]  Multiple Vitamin (MULTIVITAMIN) capsule Take 1 capsule by mouth daily.     [provider]  Multiple Vitamins-Minerals (PRESERVISION AREDS 2 PO) Take by mouth.    [provider]  promethazine (PHENERGAN) 25 MG tablet Take 1 tablet (25 mg total) by mouth every 8 (eight) hours as needed for nausea or vomiting. 06/05/15   Hyatt, Max T, DPM  ramipril (ALTACE) 5 MG capsule Take 5 mg by mouth daily.    [provider]  travoprost, benzalkonium, (TRAVATAN) 0.004 % ophthalmic solution 1 drop at bedtime.    [provider]  zolpidem (AMBIEN) 10 MG tablet Take 10 mg by mouth at bedtime as needed for sleep.    [provider]      Allergies    Erythromycin    Review of Systems   Review of Systems  Physical Exam Updated Vital Signs BP (!) 162/105 (BP Location: Left Arm)   Pulse 93   Temp 97.6 F (36.4 C) (Oral)   Resp 18   Ht 5' (1.524 m)   Wt 40.8 kg   SpO2 96%   BMI 17.58 kg/m  Physical Exam Vitals and nursing note reviewed.  Constitutional:      General: She is not in acute distress.    Appearance: Normal appearance.  HENT:     Head: Normocephalic.     Comments: Healing contusion around R eye    Nose: Nose normal.  Mouth/Throat:     Mouth: Mucous membranes are moist.     Pharynx: Oropharynx is clear.  Eyes:     Extraocular Movements: Extraocular movements intact.     Conjunctiva/sclera: Conjunctivae normal.     Pupils: Pupils are equal, round, and reactive to light.  Neck:     Comments: No midline neck tenderness Cardiovascular:     Rate and Rhythm: Normal rate and regular rhythm.     Heart sounds: Normal heart sounds.  Pulmonary:     Effort: Pulmonary effort is normal.     Breath sounds: Normal breath sounds.  Abdominal:     General: Abdomen is flat.     Palpations: Abdomen is soft.     Tenderness: There is no abdominal tenderness.  Musculoskeletal:        General: Normal range of motion.     Cervical back: Normal range of motion and neck supple.     Comments: No midline back  tenderness R>L-sided mid thoracic tenderness  No bony tenderness to bilateral UE and LE  Skin:    General: Skin is warm and dry.     Comments: Healing appearing contusions to L forearm  Neurological:     General: No focal deficit present.     Mental Status: She is alert and oriented to person, place, and time.     Sensory: No sensory deficit.     Motor: No weakness.  Psychiatric:        Mood and Affect: Mood normal.        Behavior: Behavior normal.     ED Results / Procedures / Treatments   Labs (all labs ordered are listed, but only abnormal results are displayed) Labs Reviewed  COMPREHENSIVE METABOLIC PANEL - Abnormal; Notable for the following components:      Result Value   Sodium 133 (*)    Calcium 8.7 (*)    All other components within normal limits  CBC WITH DIFFERENTIAL/PLATELET - Abnormal; Notable for the following components:   MCV 102.3 (*)    All other components within normal limits  URINALYSIS, W/ REFLEX TO CULTURE (INFECTION SUSPECTED) - Abnormal; Notable for the following components:   Ketones, ur 20 (*)    Leukocytes,Ua SMALL (*)    All other components within normal limits  CK    EKG EKG Interpretation Date/Time:  Tuesday November 22 2023 17:29:15 EST Ventricular Rate:  88 PR Interval:  168 QRS Duration:  126 QT Interval:  410 QTC Calculation: 496 R Axis:   90  Text Interpretation: Sinus rhythm with occasional Premature ventricular complexes Right bundle branch block Abnormal ECG No previous ECGs available Confirmed by Elayne Snare ([id number]) on 11/22/2023 5:58:56 PM  Radiology CT Cervical Spine Wo Contrast  Result Date: 11/22/2023 CLINICAL DATA:  Neck and back trauma, fall EXAM: CT CERVICAL SPINE WITHOUT CONTRAST CT THORACIC SPINE WITHOUT CONTRAST TECHNIQUE: Multidetector CT imaging of the cervical and thoracic spine was performed without contrast. Multiplanar CT image reconstructions were also generated. RADIATION DOSE REDUCTION: This exam was  performed according to the departmental dose-optimization program which includes automated exposure control, adjustment of the mA and/or kV according to patient size and/or use of iterative reconstruction technique. COMPARISON:  12/18/2020 CT cervical spine, no prior CT thoracic spine FINDINGS: CT CERVICAL SPINE FINDINGS Alignment: No traumatic listhesis. Trace anterolisthesis of C3 on C4, C4 on C5, and C7 on T1, as well as trace retrolisthesis of C5 on C6, appears facet mediated. Skull base and vertebrae: No acute fracture.  No primary bone lesion or focal pathologic process. Osseous fusion across the C2-C3 disc space and facets. Soft tissues and spinal canal: No prevertebral fluid or swelling. No visible canal hematoma. Disc levels: Multilevel degenerative changes, with disc height loss most prominent at C5-C6 and C6-C7. No high-grade spinal canal stenosis. Upper chest: Apical pleuroparenchymal scarring. Possible pleural plaques. No new focal pulmonary opacity or pleural effusion. Emphysema. CT THORACIC SPINE FINDINGS Alignment: Exaggeration of the normal thoracic kyphosis. No significant listhesis. Levocurvature of the midthoracic spine. Vertebrae: No acute fracture or focal pathologic process. Multiple remote compression deformities. 20% vertebral body height loss at the anterior aspect of T1; no retropulsion. Up to 30% vertebral body height loss at T6, with 4 mm retropulsion of the posteroinferior cortex. 20% vertebral body height loss at T9; no retropulsion. Up to 90% vertebral body height loss at T10, with 5 mm retropulsion of the posterosuperior cortex. Osteopenia. Paraspinal and other soft tissues: Aortic and coronary atherosclerosis. No additional focal pulmonary opacity. Emphysema. No pleural effusion. Disc levels: Mild spinal canal stenosis at T9-T10, secondary to retropulsion the posterosuperior cortex of T10. No other significant spinal canal stenosis. No high-grade neural foraminal narrowing.  IMPRESSION: 1. No acute fracture or traumatic listhesis in the cervical or thoracic spine. 2. Multiple remote compression deformities in the thoracic spine, as described above. Aortic Atherosclerosis (ICD10-I70.0) and Emphysema (ICD10-J43.9). Electronically Signed   By: Wiliam Ke M.D.   On: 11/22/2023 17:07   CT Thoracic Spine Wo Contrast  Result Date: 11/22/2023 CLINICAL DATA:  Neck and back trauma, fall EXAM: CT CERVICAL SPINE WITHOUT CONTRAST CT THORACIC SPINE WITHOUT CONTRAST TECHNIQUE: Multidetector CT imaging of the cervical and thoracic spine was performed without contrast. Multiplanar CT image reconstructions were also generated. RADIATION DOSE REDUCTION: This exam was performed according to the departmental dose-optimization program which includes automated exposure control, adjustment of the mA and/or kV according to patient size and/or use of iterative reconstruction technique. COMPARISON:  12/18/2020 CT cervical spine, no prior CT thoracic spine FINDINGS: CT CERVICAL SPINE FINDINGS Alignment: No traumatic listhesis. Trace anterolisthesis of C3 on C4, C4 on C5, and C7 on T1, as well as trace retrolisthesis of C5 on C6, appears facet mediated. Skull base and vertebrae: No acute fracture. No primary bone lesion or focal pathologic process. Osseous fusion across the C2-C3 disc space and facets. Soft tissues and spinal canal: No prevertebral fluid or swelling. No visible canal hematoma. Disc levels: Multilevel degenerative changes, with disc height loss most prominent at C5-C6 and C6-C7. No high-grade spinal canal stenosis. Upper chest: Apical pleuroparenchymal scarring. Possible pleural plaques. No new focal pulmonary opacity or pleural effusion. Emphysema. CT THORACIC SPINE FINDINGS Alignment: Exaggeration of the normal thoracic kyphosis. No significant listhesis. Levocurvature of the midthoracic spine. Vertebrae: No acute fracture or focal pathologic process. Multiple remote compression  deformities. 20% vertebral body height loss at the anterior aspect of T1; no retropulsion. Up to 30% vertebral body height loss at T6, with 4 mm retropulsion of the posteroinferior cortex. 20% vertebral body height loss at T9; no retropulsion. Up to 90% vertebral body height loss at T10, with 5 mm retropulsion of the posterosuperior cortex. Osteopenia. Paraspinal and other soft tissues: Aortic and coronary atherosclerosis. No additional focal pulmonary opacity. Emphysema. No pleural effusion. Disc levels: Mild spinal canal stenosis at T9-T10, secondary to retropulsion the posterosuperior cortex of T10. No other significant spinal canal stenosis. No high-grade neural foraminal narrowing. IMPRESSION: 1. No acute fracture or traumatic listhesis in the cervical or thoracic  spine. 2. Multiple remote compression deformities in the thoracic spine, as described above. Aortic Atherosclerosis (ICD10-I70.0) and Emphysema (ICD10-J43.9). Electronically Signed   By: Wiliam Ke M.D.   On: 11/22/2023 17:07   CT Head Wo Contrast  Result Date: 11/22/2023 CLINICAL DATA:  Fall, head trauma EXAM: CT HEAD WITHOUT CONTRAST TECHNIQUE: Contiguous axial images were obtained from the base of the skull through the vertex without intravenous contrast. RADIATION DOSE REDUCTION: This exam was performed according to the departmental dose-optimization program which includes automated exposure control, adjustment of the mA and/or kV according to patient size and/or use of iterative reconstruction technique. COMPARISON:  12/17/2020 CT head FINDINGS: Brain: No evidence of acute infarction, hemorrhage, mass, mass effect, or midline shift. No hydrocephalus or extra-axial fluid collection. Periventricular white matter changes, likely the sequela of chronic small vessel ischemic disease. Age related cerebral atrophy, with ex vacuo dilatation of ventricles. Vascular: No hyperdense vessel. Skull: Negative for fracture or focal lesion. Sinuses/Orbits:  Redemonstrated complete opacification the right maxillary sinus, with osseous thickening, compatible with chronic right maxillary sinusitis. Additional mild mucosal thickening in the ethmoid air cells. Status post bilateral lens replacements. Other: The mastoid air cells are well aerated. IMPRESSION: No acute intracranial process. Electronically Signed   By: Wiliam Ke M.D.   On: 11/22/2023 16:55   DG Pelvis 1-2 Views  Result Date: 11/22/2023 CLINICAL DATA:  [place]are[place] Seat EXAM: PELVIS - 1-2 VIEW COMPARISON:  None Available. FINDINGS: There is no evidence of pelvic fracture or diastasis. No pelvic bone lesions are seen. Surgical device at the L4-5 level, incompletely visualized. Anastomotic staple line in the mid pelvis. IMPRESSION: Negative. Electronically Signed   By: Corlis Leak M.D.   On: 11/22/2023 16:48   DG Chest 2 View  Result Date: 11/22/2023 CLINICAL DATA:  [place] Seat EXAM: CHEST - 2 VIEW COMPARISON:  None Available. FINDINGS: Lungs are clear. No definite pneumothorax. Apices excluded on the frontal radiograph. Heart size and mediastinal contours are within normal limits. Aortic Atherosclerosis (ICD10-170.0). Blunting of left posterior costophrenic angle with left retrocardiac double density suggesting hiatal hernia. Possible free osteochondral body inferior left shoulder. Lower thoracic vertebral compression deformity, age indeterminate. IMPRESSION: 1. No acute cardiopulmonary disease. 2. Lower thoracic vertebral compression deformity, age indeterminate. Electronically Signed   By: Corlis Leak M.D.   On: 11/22/2023 16:48    Procedures Procedures    Medications Ordered in ED Medications - No data to display  ED Course/ Medical Decision Making/ A&P Clinical Course as of 11/22/23 1958  Tue Nov 22, 2023  1719 Multiple remote compression deformities on the thoracic spine, no other acute injuries. Labs pending. [VK]  1932 Small ketones, but no signs of UTI. Labs otherwise within normal range. Frequent  falls may be related to deconditioning with age. [VK]  1955 After discussion with the patient and her daughter she lives at an independent living and have someone check in a couple days a week and does not feel safe returning home tonight.  Will have PT eval in the morning to determine if she may need SNF as outpatient therapy. [VK]    Clinical Course User Index [VK] Rexford Maus, DO                                 Medical Decision Making This patient presents to the ED with chief complaint(s) of frequent falls with pertinent past medical history of HTN, chronic dizziness which further complicates  the presenting complaint. The complaint involves an extensive differential diagnosis and also carries with it a high risk of complications and morbidity.    The differential diagnosis includes due to patient's age and fall concern for ICH, mass effect, cervical spine fracture, thoracic spine fracture, no other traumatic injuries seen on exam, dehydration, electrolyte abnormality, rhabdo, infection, arrhythmia, anemia  Additional history obtained: Additional history obtained from N/A Records reviewed Primary Care Documents  ED Course and Reassessment: On patient's arrival she is hemodynamically stable in no acute distress without any focal neurologic deficits, does have healing appearing bruising on exam.  Patient will have workup to evaluate for traumatic injury as well as causes of her frequent falls.  She declined any pain control at this time and will be closely reassessed.  Independent labs interpretation:  The following labs were independently interpreted: within normal range  Independent visualization of imaging: - I independently visualized the following imaging with scope of interpretation limited to determining acute life threatening conditions related to emergency care: CTH/C-spine/T-spine, CXR, pelvis XR, which revealed old appearing t-spine compression fractures but no acute  traumatic injury  Consultation: - Consulted or discussed management/test interpretation w/ external professional: TOC/PT     Amount and/or Complexity of Data Reviewed Labs: ordered. Radiology: ordered.          Final Clinical Impression(s) / ED Diagnoses Final diagnoses:  None    Rx / DC Orders ED Discharge Orders     None         Rexford Maus, DO 11/22/23 1958

## 2023-11-22 NOTE — ED Notes (Signed)
Pt resting. NAD noted at this time. 

## 2023-11-22 NOTE — ED Triage Notes (Signed)
"  Multiple falls over last few weeks, fell last night unwitnessed and slept in floor until her caregiver got there. She lives alone and is independent usually until caregiver arrives. Caregiver says she has gotten much weaker over past few weeks. Denies loss of consciousness. Only pain is in left shoulder. No deformity noted" per EMS

## 2023-11-23 NOTE — ED Provider Notes (Signed)
Emergency Medicine Observation Re-evaluation Note  Katie Leonard is a 89 y.o. female, seen on rounds today.  Pt initially presented to the ED for complaints of Fall Currently, the patient is not having any acute complaints.  Physical Exam  BP (!) 158/94 (BP Location: Left Arm)   Pulse 87   Temp 97.9 F (36.6 C) (Oral)   Resp 17   Ht 5' (1.524 m)   Wt 40.8 kg   SpO2 95%   BMI 17.58 kg/m  Physical Exam General: Resting comfortably in stretcher Lungs: Normal work of breathing Psych: Calm  ED Course / MDM  EKG:EKG Interpretation Date/Time:  Tuesday November 22 2023 17:29:15 EST Ventricular Rate:  88 PR Interval:  168 QRS Duration:  126 QT Interval:  410 QTC Calculation: 496 R Axis:   90  Text Interpretation: Sinus rhythm with occasional Premature ventricular complexes Right bundle branch block Abnormal ECG No previous ECGs available Confirmed by Elayne Snare ([id number]) on 11/22/2023 5:58:56 PM  I have reviewed the labs performed to date as well as medications administered while in observation.  Recent changes in the last 24 hours include patient was evaluated and medically cleared.  Lives at independent living but does not feel safe going back so PT evaluation has been ordered to see if she needs to go to rehab.  Plan  Current plan is for PT evaluation.  Placement.    Rondel Baton, MD 11/23/23 [phone]

## 2023-11-23 NOTE — Discharge Instructions (Addendum)
[facility] accepted your referral for Physical Therapy and Occupational Therapy. A representative from the agency will contact you within 24-48 hours.

## 2023-11-23 NOTE — Discharge Planning (Signed)
Katie Minahan J. Lucretia Roers, RN, BSN, [place] [phone] TOC spoke with daughter, Dewayne Hatch via telephone regarding discharge planning for [name]. Offered pt medicare.gov list of home health agencies to choose from.  Ann chose [facility] to render PT and OTservices. Bjorn Loser of [facility] notified.  No DME needs identified at this time.

## 2023-11-23 NOTE — ED Notes (Signed)
Pt taken to bathroom with sara steady.

## 2023-11-23 NOTE — Evaluation (Signed)
Physical Therapy Evaluation Patient Details Name: Katie Leonard MRN: [id number] DOB: 08/23/1925 Today's Date: 11/23/2023  History of Present Illness  89 yo female  brought to Ed 11/22/23 with multiple falls. CT, multiple remote compression fractures thoracic spine.PMH: chronic dizziness and HTN.  Clinical Impression  Pt admitted with above diagnosis.  Pt currently with functional limitations due to the deficits listed below (see PT Problem List). Pt will benefit from acute skilled PT to increase their independence and safety with mobility to allow discharge.   The patient  demonstrates decreased balance during ambulation, reliant on Device, reports use of rollator.  Patient reports multiple falls. Patient can benefit from post acute rehab but  insurance not covering so recommend HHPT and 24/7 assistance due to frequent falls. Patient does have PCA 4* x 4 days/week. Family not present.         If plan is discharge home, recommend the following: A little help with walking and/or transfers;A little help with bathing/dressing/bathroom;Assistance with cooking/housework;Assist for transportation;Help with stairs or ramp for entrance   Can travel by private vehicle        Equipment Recommendations None recommended by PT  Recommendations for Other Services       Functional Status Assessment Patient has had a recent decline in their functional status and demonstrates the ability to make significant improvements in function in a reasonable and predictable amount of time.     Precautions / Restrictions Precautions Precautions: Fall Restrictions Weight Bearing Restrictions: No      Mobility  Bed Mobility Overal bed mobility: Independent                  Transfers Overall transfer level: Needs assistance Equipment used: Rolling walker (2 wheels) Transfers: Sit to/from Stand Sit to Stand: Min assist           General transfer comment: steady assist , reports does better with  sneakers.    Ambulation/Gait Ambulation/Gait assistance: Min assist Gait Distance (Feet): 50 Feet Assistive device: Rolling walker (2 wheels) Gait Pattern/deviations: Step-through pattern, Staggering right, Staggering left Gait velocity: decr     General Gait Details: gait unstedy, support required at times , esp with turning, patient is used to Investment banker, corporate     Tilt Bed    Modified Rankin (Stroke Patients Only)       Balance Overall balance assessment: History of Falls, Needs assistance Sitting-balance support: No upper extremity supported, Feet supported Sitting balance-Leahy Scale: Good     Standing balance support: During functional activity, Reliant on assistive device for balance Standing balance-Leahy Scale: Poor Standing balance comment: relaint on UE support                             Pertinent Vitals/Pain Pain Assessment Pain Assessment: Faces Faces Pain Scale: Hurts little more Pain Location: back Pain Descriptors / Indicators: Discomfort    Home Living Family/patient expects to be discharged to:: Private residence Living Arrangements: Alone;Children Available Help at Discharge: Personal care attendant;Available PRN/intermittently Type of Home: House Home Access: Level entry       Home Layout: One level Home Equipment: Rollator (4 wheels);Rolling Walker (2 wheels) Additional Comments: has PCA 4* x 4 days    Prior Function Prior Level of Function : Independent/Modified Independent             Mobility Comments: uses a rollator ADLs  Comments: PCA assists with bath/shower, meals     Extremity/Trunk Assessment   Upper Extremity Assessment Upper Extremity Assessment: Overall WFL for tasks assessed    Lower Extremity Assessment Lower Extremity Assessment: Generalized weakness    Cervical / Trunk Assessment Cervical / Trunk Assessment: Kyphotic  Communication    Communication Communication: No apparent difficulties  Cognition Arousal: Alert Behavior During Therapy: WFL for tasks assessed/performed Overall Cognitive Status: Difficult to assess Area of Impairment: Safety/judgement                         Safety/Judgement: Decreased awareness of deficits, Decreased awareness of safety     General Comments: reports cannot have  caregiver 24/7, A/O x 4        General Comments      Exercises     Assessment/Plan    PT Assessment Patient needs continued PT services  PT Problem List Decreased strength;Decreased mobility;Decreased safety awareness;Decreased activity tolerance;Decreased balance;Pain       PT Treatment Interventions DME instruction;Therapeutic activities;Gait training;Therapeutic exercise;Patient/family education;Balance training;Functional mobility training    PT Goals (Current goals can be found in the Care Plan section)  Acute Rehab PT Goals Patient Stated Goal: to go home PT Goal Formulation: With patient Time For Goal Achievement: 12/07/23 Potential to Achieve Goals: Good    Frequency Min 1X/week     Co-evaluation               AM-PAC PT "6 Clicks" Mobility  Outcome Measure Help needed turning from your back to your side while in a flat bed without using bedrails?: None Help needed moving from lying on your back to sitting on the side of a flat bed without using bedrails?: None Help needed moving to and from a bed to a chair (including a wheelchair)?: A Little Help needed standing up from a chair using your arms (e.g., wheelchair or bedside chair)?: A Little Help needed to walk in hospital room?: A Little Help needed climbing 3-5 steps with a railing? : A Lot 6 Click Score: 19    End of Session Equipment Utilized During Treatment: Gait belt Activity Tolerance: Patient tolerated treatment well Patient left: in bed;with nursing/sitter in room (sitting on stratcher, rails up, RN aware) Nurse  Communication: Mobility status PT Visit Diagnosis: Unsteadiness on feet (R26.81)    Time: 9518-8416 PT Time Calculation (min) (ACUTE ONLY): 30 min   Charges:   PT Evaluation $PT Eval Low Complexity: 1 Low PT Treatments $Gait Training: 8-22 mins PT General Charges $$ ACUTE PT VISIT: 1 Visit          Blanchard Kelch PT Acute Rehabilitation Services Office [phone] Weekend [phone]  Rada Hay 11/23/2023, 9:51 AM

## 2023-11-23 NOTE — Progress Notes (Addendum)
CSW received call from Pt's daughter who informed she is on the way to pick pt up. She is interested in [place] if [facility] is unable to come into the home for PT/OT. Daughter states the pt is currently in Independent Living; however, is #2 on the list to move into the Assisted Living. Daughter states she has tried multiple times to secure more assistance but pt declines. CSW informed daughter that pt is agreeable to [facility].   Per RNCM, [facility] accepted pt for [facility] services (PT/OT). Info added to pt's AVS. NO further TOC needs. Daughter has arrived to pick pt up.

## 2023-11-23 NOTE — Progress Notes (Signed)
Transition of Care [facility]) - Emergency Department Mini Assessment   Patient Details  Name: Katie Leonard MRN: [id number] Date of Birth: Sep 29, 1925  Transition of Care [facility]) CM/SW Contact:    Princella Ion, LCSW Phone Number: 11/23/2023, 10:26 AM   Clinical Narrative: [facility] consulted for SNF. Per chart review, patient has Medicare A/B without [facility] waiver and patient does not have a qualifying stay to be placed into SNF from ED.   CSW met with pt at bedside. It appears pt was not interested in SNF. She did state she currently lives at [facility] in [place] where she has an Aide come in 4 days a week in the afternoon. She also reports her daughter does not live far. Patient states she is "#3 on the list" at the community and suspects if she continues to have falls, she will end up on the rehab side. Patient is agreeable to [facility] at this time. CSW did offer choice and patient requested that our team outreach to [facility] PT to see if they can have someone come to her home. If not, she is open to another agency that accepts her insurance. CSW informed RNCM who will set up [facility].   Patient did ask this CSW to outreach to her daughter so she could pick her up. CSW attempted to contact pt's daughter, Dewayne Hatch, without success. Left HIPAA Compliant voicemail requesting a call back.    ED Mini Assessment: What brought you to the Emergency Department? : unwitnessed fall  Barriers to Discharge: No Barriers Identified     Means of departure: Car       Patient Contact and Communications Key Contact 1: Patient Key Contact 2: Attempted pt's daughter, Dario Ave with: Patient Contact Date: 11/23/23,   Contact time: 1000 Contact Phone Number: Mosetta Pigeon (Daughter)  [phone]    Patient states their goals for this hospitalization and ongoing recovery are:: Return to [employer].gov Compare Post Acute Care list provided to:: Patient Choice  offered to / list presented to : Patient  Admission diagnosis:  Repeated Falls There are no problems to display for this patient.  PCP:  Eartha Inch, MD Pharmacy:   [facility] [place], [place] - [address] [place] [address] Phone: [phone] Fax: [phone]

## 2023-11-23 NOTE — ED Provider Notes (Signed)
Patient feeling much better.  Physical therapy has been arranged.  Home health PT order has been placed.  Daughter at the bedside.  All questions answered.   Rondel Baton, MD 11/23/23 [phone]

## 2023-12-09 ENCOUNTER — Ambulatory Visit: Payer: Self-pay | Admitting: *Deleted

## 2023-12-09 NOTE — Telephone Encounter (Signed)
Community line call: patient daughter(DPR) Emergency planning/management officer Complaint: Recent fall- followed by second fall. Patient was evaluated for first fall- but not for second fall. Patient does live in assisted living and nurses did examine her after fall for apparent injury- non swelling, bruising.  Symptoms: back pain- moderate Frequency: over 1 week  Disposition: [] ED /[] Urgent Care (no appt availability in office) / [] Appointment(In office/virtual)/ []  [facility]/ [] Home Care/ [] Refused Recommended Disposition /[] [facility]/ [x]  Follow-up with PCP Additional Notes: Patient has had second fall- due to patient age/risk factors- advised evaluation. ED, ortho UC, call PCP for appointment- they may order second Xray for second fall for patient.

## 2023-12-09 NOTE — Telephone Encounter (Signed)
Reason for Disposition . [1] Caller has NON-URGENT question AND [2] triager unable to answer question  Answer Assessment - Initial Assessment Questions 1. MECHANISM: "How did the fall happen?"     Monday this week - second fall- patient fell prior to that and was seen at ED- family wants advised about where to take patient for evaluation 2. DOMESTIC VIOLENCE AND ELDER ABUSE SCREENING: "Did you fall because someone pushed you or tried to hurt you?" If Yes, ask: "Are you safe now?"     no 3. ONSET: "When did the fall happen?" (e.g., minutes, hours, or days ago)     Frequent falls- patient fell 1 week ago- patient slipped in the bathroom with second fall- patient stated she hit her head- patient doesn't remember the fall- but fell on tile floor- was checked by nurses at facility- no apparent injury 4. LOCATION: "What part of the body hit the ground?" (e.g., back, buttocks, head, hips, knees, hands, head, stomach)     Patient fell again- possible back and head- not witnessed- patient hit her emergency alarm 5. INJURY: "Did you hurt (injure) yourself when you fell?" If Yes, ask: "What did you injure? Tell me more about this?" (e.g., body area; type of injury; pain severity)"     Patient had back pain from previous fall and is not better- not sure if she injured herself with second fall. 6. PAIN: "Is there any pain?" If Yes, ask: "How bad is the pain?" (e.g., Scale 1-10; or mild,  moderate, severe)   - NONE (0): No pain   - MILD (1-3): Doesn't interfere with normal activities    - MODERATE (4-7): Interferes with normal activities or awakens from sleep    - SEVERE (8-10): Excruciating pain, unable to do any normal activities      Moderate-7/10 7. SIZE: For cuts, bruises, or swelling, ask: "How large is it?" (e.g., inches or centimeters)      No bruising, swelling  9. OTHER SYMPTOMS: "Do you have any other symptoms?" (e.g., dizziness, fever, weakness; new onset or worsening).      Frequent  dizziness 10. CAUSE: "What do you think caused the fall (or falling)?" (e.g., tripped, dizzy spell)       dizziness  Protocols used: Falls and [facility]

## 2024-09-24 ENCOUNTER — Other Ambulatory Visit: Payer: Self-pay

## 2024-09-24 ENCOUNTER — Emergency Department (HOSPITAL_COMMUNITY)

## 2024-09-24 ENCOUNTER — Emergency Department (HOSPITAL_COMMUNITY)
Admission: EM | Admit: 2024-09-24 | Discharge: 2024-09-24 | Disposition: A | Discharge status: Skilled Nursing Facility | Attending: Emergency Medicine | Admitting: Emergency Medicine

## 2024-09-24 DIAGNOSIS — W01198A Fall on same level from slipping, tripping and stumbling with subsequent striking against other object, initial encounter: Secondary | ICD-10-CM | POA: Insufficient documentation

## 2024-09-24 DIAGNOSIS — S0003XA Contusion of scalp, initial encounter: Secondary | ICD-10-CM | POA: Insufficient documentation

## 2024-09-24 DIAGNOSIS — Z87891 Personal history of nicotine dependence: Secondary | ICD-10-CM | POA: Diagnosis not present

## 2024-09-24 DIAGNOSIS — W19XXXA Unspecified fall, initial encounter: Secondary | ICD-10-CM

## 2024-09-24 DIAGNOSIS — Z85038 Personal history of other malignant neoplasm of large intestine: Secondary | ICD-10-CM | POA: Diagnosis not present

## 2024-09-24 DIAGNOSIS — I10 Essential (primary) hypertension: Secondary | ICD-10-CM | POA: Insufficient documentation

## 2024-09-24 DIAGNOSIS — Z79899 Other long term (current) drug therapy: Secondary | ICD-10-CM | POA: Insufficient documentation

## 2024-09-24 DIAGNOSIS — S0990XA Unspecified injury of head, initial encounter: Secondary | ICD-10-CM | POA: Diagnosis present

## 2024-09-24 MED ORDER — ACETAMINOPHEN 500 MG PO TABS: 1000.0000 mg | ORAL_TABLET | Freq: Once | ORAL | Status: DC

## 2024-09-24 NOTE — Discharge Instructions (Signed)
 Your head and neck CT imaging today were negative.  You do have a compression fracture at T4, which is in your upper back, however this is old and is likely just 1 of those things that can happen with age.  Please follow-up with your primary care doctor.  You may continue taking all medications as prescribed.

## 2024-09-24 NOTE — ED Notes (Signed)
 [facility] called about patient. They will be here in about 1 hour

## 2024-09-24 NOTE — ED Triage Notes (Signed)
 Pt BIB [facility] from facility [facility]. Unwitnessed fall and hit posterior head. Uses walker to ambulate. Blood noted to back of head. No LOC or thinners. Pt got herself up off floor. A&Ox4 per EMS. Wants daughter called with update.

## 2024-09-24 NOTE — ED Provider Notes (Signed)
 [place] EMERGENCY DEPARTMENT AT [facility] Provider Note  CSN: [id number] Arrival date & time: 09/24/24 1118  Chief Complaint(s) No chief complaint on file.  HPI Katie Leonard is a 89 y.o. female who is here today after she had a nonsyncopal fall from standing.  Patient lives at assisted living facility, was using her walker, getting ready to have an aide assist her to take a shower, which she tells me I hate to have happened because I like to do everything by myself when she lost her balance and fell over backwards striking her head.  Patient reports that she not infrequently has falls.  She denies any pain in her shoulders, chest, arms, abdomen, pelvis.  She states she has been feeling well over the last few days.   Past Medical History Past Medical History:  Diagnosis Date   Colon cancer (HCC)    Glaucoma    Hypertension    Insomnia    There are no active problems to display for this patient.  Home Medication(s) Prior to Admission medications   Medication Sig Start Date End Date Taking? Authorizing Provider  azelastine  (ASTELIN ) 0.1 % nasal spray Place 1 spray into both nostrils 2 (two) times daily.    [provider]  BACK & BODY EXTRA STRENGTH 500-32.5 MG TABS Take 1 tablet by mouth 2 (two) times daily as needed (for pain).    [provider]  Calcium Carb-Cholecalciferol (CALCIUM 600 + D PO) Take 1 tablet by mouth See admin instructions. Chew 1 tablet by mouth twice EVERY OTHER DAY    [provider]  clobetasol cream (TEMOVATE) 0.05 % Apply 1 Application topically 2 (two) times daily as needed (for irritation- affected sites).    [provider]  desoximetasone (TOPICORT) 0.25 % cream Apply 1 Application topically 2 (two) times daily as needed (irritation- affected sites).    [provider]  ipratropium (ATROVENT ) 0.03 % nasal spray Place 2 sprays into both nostrils every 12 (twelve) hours.    [provider]   meclizine  (ANTIVERT ) 25 MG tablet Take 6.25-25 mg by mouth 3 (three) times daily as needed for dizziness.    [provider]  metoprolol tartrate (LOPRESSOR) 25 MG tablet Take 12.5 mg by mouth 2 (two) times daily.    [provider]  Multiple Vitamins-Minerals (PRESERVISION AREDS 2 PO) Take 1 capsule by mouth in the morning and at bedtime.    [provider]  nitroGLYCERIN (NITROSTAT) 0.3 MG SL tablet Place 0.3 mg under the tongue every 5 (five) minutes as needed for chest pain.    [provider]  promethazine  (PHENERGAN ) 25 MG tablet Take 1 tablet (25 mg total) by mouth every 8 (eight) hours as needed for nausea or vomiting. Patient not taking: Reported on 11/22/2023 06/05/15   Hyatt, Max T, DPM  tacrolimus (PROTOPIC) 0.1 % ointment Apply 1 Application topically See admin instructions. Apply as directed twice a day to the eyelids    [provider]  timolol  (BETIMOL ) 0.25 % ophthalmic solution Place 1 drop into both eyes 2 (two) times daily.    [provider]  travoprost , benzalkonium, (TRAVATAN ) 0.004 % ophthalmic solution Place 1 drop into both eyes at bedtime.    [provider]  triamcinolone (KENALOG) 0.025 % ointment Apply 1 Application topically 2 (two) times daily as needed (for itching).    [provider]  zolpidem  (AMBIEN ) 5 MG tablet Take 1.25 mg by mouth at bedtime.    [provider]                                                                                                                                    Past Surgical History No past surgical history on file. Family History No family history on file.  Social History Social History   Tobacco Use   Smoking status: Former    Current packs/day: 0.00    Types: Cigarettes    Quit date: 04/21/1978    Years since quitting: 46.4   Allergies Erythromycin, Other, and Pine  Review of Systems Review of Systems  Physical Exam Vital Signs  I  have reviewed the triage vital signs BP (!) 175/85   Pulse 68   Temp 98.1 F (36.7 C) (Oral)   Resp 15   Ht 5' (1.524 m)   Wt 40.8 kg   SpO2 96%   BMI 17.58 kg/m   Physical Exam Vitals and nursing note reviewed.  Constitutional:      Appearance: Normal appearance.  HENT:     Head: Normocephalic.     Comments: There is a hematoma on the left occiput, no laceration.    Mouth/Throat:     Mouth: Mucous membranes are dry.  Eyes:     Pupils: Pupils are equal, round, and reactive to light.  Cardiovascular:     Rate and Rhythm: Normal rate.  Pulmonary:     Effort: Pulmonary effort is normal.  Abdominal:     General: Abdomen is flat.     Palpations: Abdomen is soft.  Musculoskeletal:     Cervical back: Normal range of motion. No rigidity.     Comments: No tenderness to palpation in the bilateral shoulders, upper arms, elbows, forearms or wrists.  No tenderness to palpation in the chest.  Pelvis stable, nontender.  No tenderness, deformities noted on bilateral upper legs, knees, lower legs or ankles.  Patient able to lift both legs from the bed.  Lymphadenopathy:     Cervical: No cervical adenopathy.  Neurological:     General: No focal deficit present.     Mental Status: She is alert and oriented to person, place, and time.     ED Results and Treatments Labs (all labs ordered are listed, but only abnormal results are displayed) Labs Reviewed - No data to display  Radiology CT Head Wo Contrast Result Date: 09/24/2024 CLINICAL DATA:  Provided history: Ataxia, head trauma. Additional history provided: Unwitnessed fall. Posterior scalp hematoma. EXAM: CT HEAD WITHOUT CONTRAST CT CERVICAL SPINE WITHOUT CONTRAST TECHNIQUE: Multidetector CT imaging of the head and cervical spine was performed following the standard protocol without intravenous contrast. Multiplanar CT  image reconstructions of the cervical spine were also generated. RADIATION DOSE REDUCTION: This exam was performed according to the departmental dose-optimization program which includes automated exposure control, adjustment of the mA and/or kV according to patient size and/or use of iterative reconstruction technique. COMPARISON:  Head CT 11/22/2023. Cervical spine CT 11/22/2023. Thoracic spine CT 11/22/2023. FINDINGS: CT HEAD FINDINGS Brain: Generalized cerebral and cerebellar atrophy. 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 Patchy and ill-defined hypoattenuation within the cerebral white matter, nonspecific but compatible with mild-to-moderate chronic small vessel ischemic disease. There is no acute intracranial hemorrhage. No acute demarcated cortical infarct. No extra-axial fluid collection. No evidence of an intracranial mass. No midline shift. Vascular: No hyperdense vessel. Atherosclerotic calcifications. Skull: No calvarial fracture or aggressive osseous lesion. Sinuses/Orbits: No mass or acute finding within the imaged orbits. Complete opacification of the right maxillary sinus at the imaged levels. Mild right frontoethmoidal sinusitis. Other: Sizable posterior scalp hematoma on the left. CT CERVICAL SPINE FINDINGS Alignment: 2 mm grade 1 anterolisthesis at C3-C4 and C4-C5. Slight C5-C6 grade 1 retrolisthesis. 3 mm C7-T1 grade 1 anterolisthesis. 2 mm grade 1 anterolisthesis at T1-T2, T2-T3 and T3-T4. Skull base and vertebrae: The basion-dental and atlanto-dental intervals are maintained.No evidence of acute fracture to the cervical spine. Bilateral C2-C3 facet ankylosis. Redemonstrated chronic T1 and T3 superior endplate vertebral compression fractures (with mild vertebral body height loss). Incompletely imaged T4 superior endplate vertebral compression fracture (with 30-40% vertebral body height loss), new from the prior thoracic spine CT of 11/22/2023 but  otherwise age-indeterminate. Soft tissues and spinal canal: No prevertebral fluid or swelling. No visible canal hematoma. Disc levels: Cervical spondylosis with multilevel disc space narrowing, disc bulges/central disc protrusions, posterior disc osteophyte complexes, uncovertebral hypertrophy and facet arthropathy. Disc space narrowing is greatest at C5-C6 and C6-C7 (advanced at these levels). No appreciable high-grade spinal canal stenosis. Multilevel bony neural foraminal narrowing. Degenerative changes also present at the C1-C2 articulation. Upper chest: No consolidation within the imaged lung apices. Biapical pleuroparenchymal scarring. Emphysema. IMPRESSION: CT head: 1. No evidence of an acute intracranial abnormality. 2. 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 3. Background parenchymal atrophy and chronic small vessel ischemic disease. 4. Sizable posterior scalp hematoma on the left. 5. Paranasal sinus disease at the imaged levels, as described (including severe right maxillary sinusitis). CT cervical spine: 1. No evidence of an acute cervical spine fracture. 2. Grade 1 spondylolisthesis at C3-C4, C4-C5, C5-C6, C7-T1, T1-T2, T2-T3 and T3-T4, unchanged from the prior cervical spine CT of 11/22/2023. 3. Cervical spondylosis as described. 4. Bilateral C2-C3 facet ankylosis. 5. Incompletely imaged T4 superior endplate vertebral compression fracture (with 30-40% vertebral body height loss), new from the prior thoracic spine CT of 11/22/2023 but otherwise age-indeterminate. Correlate for point tenderness at this level. 6. Unchanged chronic T1 and T3 superior endplate vertebral compression fractures. Electronically Signed   By: Rockey Childs D.O.   On: 09/24/2024 12:38   CT Cervical Spine Wo Contrast Result Date: 09/24/2024 CLINICAL DATA:  Provided history: Ataxia, head trauma. Additional history provided: Unwitnessed fall. Posterior scalp hematoma. EXAM: CT HEAD WITHOUT  CONTRAST CT CERVICAL SPINE WITHOUT  CONTRAST TECHNIQUE: Multidetector CT imaging of the head and cervical spine was performed following the standard protocol without intravenous contrast. Multiplanar CT image reconstructions of the cervical spine were also generated. RADIATION DOSE REDUCTION: This exam was performed according to the departmental dose-optimization program which includes automated exposure control, adjustment of the mA and/or kV according to patient size and/or use of iterative reconstruction technique. COMPARISON:  Head CT 11/22/2023. Cervical spine CT 11/22/2023. Thoracic spine CT 11/22/2023. FINDINGS: CT HEAD FINDINGS Brain: Generalized cerebral and cerebellar atrophy. 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 Patchy and ill-defined hypoattenuation within the cerebral white matter, nonspecific but compatible with mild-to-moderate chronic small vessel ischemic disease. There is no acute intracranial hemorrhage. No acute demarcated cortical infarct. No extra-axial fluid collection. No evidence of an intracranial mass. No midline shift. Vascular: No hyperdense vessel. Atherosclerotic calcifications. Skull: No calvarial fracture or aggressive osseous lesion. Sinuses/Orbits: No mass or acute finding within the imaged orbits. Complete opacification of the right maxillary sinus at the imaged levels. Mild right frontoethmoidal sinusitis. Other: Sizable posterior scalp hematoma on the left. CT CERVICAL SPINE FINDINGS Alignment: 2 mm grade 1 anterolisthesis at C3-C4 and C4-C5. Slight C5-C6 grade 1 retrolisthesis. 3 mm C7-T1 grade 1 anterolisthesis. 2 mm grade 1 anterolisthesis at T1-T2, T2-T3 and T3-T4. Skull base and vertebrae: The basion-dental and atlanto-dental intervals are maintained.No evidence of acute fracture to the cervical spine. Bilateral C2-C3 facet ankylosis. Redemonstrated chronic T1 and T3 superior endplate vertebral compression fractures (with mild  vertebral body height loss). Incompletely imaged T4 superior endplate vertebral compression fracture (with 30-40% vertebral body height loss), new from the prior thoracic spine CT of 11/22/2023 but otherwise age-indeterminate. Soft tissues and spinal canal: No prevertebral fluid or swelling. No visible canal hematoma. Disc levels: Cervical spondylosis with multilevel disc space narrowing, disc bulges/central disc protrusions, posterior disc osteophyte complexes, uncovertebral hypertrophy and facet arthropathy. Disc space narrowing is greatest at C5-C6 and C6-C7 (advanced at these levels). No appreciable high-grade spinal canal stenosis. Multilevel bony neural foraminal narrowing. Degenerative changes also present at the C1-C2 articulation. Upper chest: No consolidation within the imaged lung apices. Biapical pleuroparenchymal scarring. Emphysema. IMPRESSION: CT head: 1. No evidence of an acute intracranial abnormality. 2. 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 3. Background parenchymal atrophy and chronic small vessel ischemic disease. 4. Sizable posterior scalp hematoma on the left. 5. Paranasal sinus disease at the imaged levels, as described (including severe right maxillary sinusitis). CT cervical spine: 1. No evidence of an acute cervical spine fracture. 2. Grade 1 spondylolisthesis at C3-C4, C4-C5, C5-C6, C7-T1, T1-T2, T2-T3 and T3-T4, unchanged from the prior cervical spine CT of 11/22/2023. 3. Cervical spondylosis as described. 4. Bilateral C2-C3 facet ankylosis. 5. Incompletely imaged T4 superior endplate vertebral compression fracture (with 30-40% vertebral body height loss), new from the prior thoracic spine CT of 11/22/2023 but otherwise age-indeterminate. Correlate for point tenderness at this level. 6. Unchanged chronic T1 and T3 superior endplate vertebral compression fractures. Electronically Signed   By: Rockey Childs D.O.   On: 09/24/2024 12:38     Pertinent labs & imaging results that were available during my care of the patient were reviewed by me and considered in my medical decision making (see MDM for details).  Medications Ordered in ED Medications  acetaminophen  (TYLENOL ) tablet 1,000 mg (1,000 mg Oral Patient Refused/Not Given 09/24/24 1125)  Procedures Procedures  (including critical care time)  Medical Decision Making / ED Course   This patient presents to the ED for concern of fall, this involves an extensive number of treatment options, and is a complaint that carries with it a high risk of complications and morbidity.  The differential diagnosis includes head hematoma, intracranial hemorrhage, C-spine fracture, C-spine contusion.  MDM: Patient overall looks fantastic.  Patient incredibly sharp for her age, making jokes.  When I entered the room, she told me you have 30 minutes to get me out of here.  I explained the patient would likely take a little bit longer.  She has a reassuring physical exam, and aside from a hematoma on the left occiput, no obvious injuries.  Patient completely alert and oriented.  Will obtain imaging of the patient's head and C-spine.  No other musculoskeletal injuries identified.  I offered to perform blood work, patient, however she declined.  Given the patient's mental clarity, I believe this is reasonable and she has full capacity to decline any blood work.  Reassessment 12:40 PM-my dependent review the patient's head CT shows no intracranial hemorrhage.  Her thoracic spine shows age-indeterminate T4 compression fracture.  Patient not tender over this area, likely chronic.  Patient appropriate for discharge.   Additional history obtained: -Additional history obtained from EMS -External records from outside source obtained and reviewed including: Chart review  including previous notes, labs, imaging, consultation notes   Lab Tests: -I ordered, reviewed, and interpreted labs.   The pertinent results include:   Labs Reviewed - No data to display    Imaging Studies ordered: I ordered imaging studies including CT head and neck I independently visualized and interpreted imaging. I agree with the radiologist interpretation   Medicines ordered and prescription drug management: Meds ordered this encounter  Medications   acetaminophen  (TYLENOL ) tablet 1,000 mg    -I have reviewed the patients home medicines and have made adjustments as needed   Cardiac Monitoring: The patient was maintained on a cardiac monitor.  I personally viewed and interpreted the cardiac monitored which showed an underlying rhythm of: Normal sinus rhythm  Social Determinants of Health:  Factors impacting patients care include: Lack of access to primary care   Reevaluation: After the interventions noted above, I reevaluated the patient and found that they have :improved  Co morbidities that complicate the patient evaluation  Past Medical History:  Diagnosis Date   Colon cancer (HCC)    Glaucoma    Hypertension    Insomnia       Dispostion: I considered admission for this patient, however she is appropriate for discharge.     Final Clinical Impression(s) / ED Diagnoses Final diagnoses:  Fall, initial encounter  Contusion of scalp, initial encounter     @PCDICTATION @    Mannie Pac T, DO 09/24/24 1257

## 2024-12-01 ENCOUNTER — Observation Stay (HOSPITAL_BASED_OUTPATIENT_CLINIC_OR_DEPARTMENT_OTHER)
Admission: EM | Admit: 2024-12-01 | Discharge: 2024-12-04 | Disposition: A | Discharge status: Skilled Nursing Facility | Attending: Emergency Medicine | Admitting: Emergency Medicine

## 2024-12-01 ENCOUNTER — Emergency Department (HOSPITAL_BASED_OUTPATIENT_CLINIC_OR_DEPARTMENT_OTHER)

## 2024-12-01 DIAGNOSIS — R42 Dizziness and giddiness: Secondary | ICD-10-CM | POA: Diagnosis not present

## 2024-12-01 DIAGNOSIS — S20219A Contusion of unspecified front wall of thorax, initial encounter: Secondary | ICD-10-CM | POA: Diagnosis not present

## 2024-12-01 DIAGNOSIS — Z79899 Other long term (current) drug therapy: Secondary | ICD-10-CM | POA: Diagnosis not present

## 2024-12-01 DIAGNOSIS — M549 Dorsalgia, unspecified: Secondary | ICD-10-CM | POA: Diagnosis present

## 2024-12-01 DIAGNOSIS — Z87891 Personal history of nicotine dependence: Secondary | ICD-10-CM | POA: Diagnosis not present

## 2024-12-01 DIAGNOSIS — S02609A Fracture of mandible, unspecified, initial encounter for closed fracture: Secondary | ICD-10-CM | POA: Diagnosis not present

## 2024-12-01 DIAGNOSIS — R0603 Acute respiratory distress: Secondary | ICD-10-CM | POA: Diagnosis not present

## 2024-12-01 DIAGNOSIS — S27892A Contusion of other specified intrathoracic organs, initial encounter: Secondary | ICD-10-CM

## 2024-12-01 DIAGNOSIS — S2221XA Fracture of manubrium, initial encounter for closed fracture: Secondary | ICD-10-CM | POA: Diagnosis not present

## 2024-12-01 DIAGNOSIS — I10 Essential (primary) hypertension: Secondary | ICD-10-CM | POA: Diagnosis not present

## 2024-12-01 DIAGNOSIS — G9340 Encephalopathy, unspecified: Secondary | ICD-10-CM | POA: Diagnosis not present

## 2024-12-01 DIAGNOSIS — W19XXXA Unspecified fall, initial encounter: Secondary | ICD-10-CM | POA: Diagnosis not present

## 2024-12-01 DIAGNOSIS — Y9289 Other specified places as the place of occurrence of the external cause: Secondary | ICD-10-CM | POA: Diagnosis not present

## 2024-12-01 DIAGNOSIS — S0101XA Laceration without foreign body of scalp, initial encounter: Secondary | ICD-10-CM | POA: Diagnosis not present

## 2024-12-01 DIAGNOSIS — Z85038 Personal history of other malignant neoplasm of large intestine: Secondary | ICD-10-CM | POA: Diagnosis not present

## 2024-12-01 DIAGNOSIS — E785 Hyperlipidemia, unspecified: Secondary | ICD-10-CM | POA: Diagnosis not present

## 2024-12-01 DIAGNOSIS — R2689 Other abnormalities of gait and mobility: Secondary | ICD-10-CM | POA: Diagnosis not present

## 2024-12-01 LAB — CBC WITH DIFFERENTIAL/PLATELET
Abs Immature Granulocytes: 0.11 K/uL — ABNORMAL HIGH (ref 0.00–0.07)
Basophils Absolute: 0.1 K/uL (ref 0.0–0.1)
Basophils Relative: 1 %
Eosinophils Absolute: 0.1 K/uL (ref 0.0–0.5)
Eosinophils Relative: 1 %
HCT: 41.4 % (ref 36.0–46.0)
Hemoglobin: 13.6 g/dL (ref 12.0–15.0)
Immature Granulocytes: 1 %
Lymphocytes Relative: 14 %
Lymphs Abs: 1.5 K/uL (ref 0.7–4.0)
MCH: 33.2 pg (ref 26.0–34.0)
MCHC: 32.9 g/dL (ref 30.0–36.0)
MCV: 101 fL — ABNORMAL HIGH (ref 80.0–100.0)
Monocytes Absolute: 0.7 K/uL (ref 0.1–1.0)
Monocytes Relative: 7 %
Neutro Abs: 8.2 K/uL — ABNORMAL HIGH (ref 1.7–7.7)
Neutrophils Relative %: 76 %
Platelets: 232 K/uL (ref 150–400)
RBC: 4.1 MIL/uL (ref 3.87–5.11)
RDW: 13.5 % (ref 11.5–15.5)
WBC: 10.7 K/uL — ABNORMAL HIGH (ref 4.0–10.5)
nRBC: 0 % (ref 0.0–0.2)

## 2024-12-01 LAB — BASIC METABOLIC PANEL WITH GFR
Anion gap: 8 (ref 5–15)
BUN: 16 mg/dL (ref 8–23)
CO2: 28 mmol/L (ref 22–32)
Calcium: 9.3 mg/dL (ref 8.9–10.3)
Chloride: 104 mmol/L (ref 98–111)
Creatinine, Ser: 0.56 mg/dL (ref 0.44–1.00)
GFR, Estimated: >60 mL/min (ref >60)
Glucose, Bld: 107 mg/dL — ABNORMAL HIGH (ref 70–99)
Potassium: 4.1 mmol/L (ref 3.5–5.1)
Sodium: 140 mmol/L (ref 135–145)

## 2024-12-01 LAB — TROPONIN T, HIGH SENSITIVITY: Troponin T High Sensitivity: <15 ng/L (ref 0–19)

## 2024-12-01 MED ORDER — OXYCODONE HCL 5 MG PO TABS
5.0000 mg | ORAL_TABLET | Freq: Once | ORAL | Status: AC
  Administered 2024-12-01: 5 mg via ORAL
  Filled 2024-12-01: qty 1

## 2024-12-01 MED ORDER — ACETAMINOPHEN 500 MG PO TABS
1000.0000 mg | ORAL_TABLET | Freq: Once | ORAL | Status: AC
  Administered 2024-12-01: 1000 mg via ORAL
  Filled 2024-12-01: qty 2

## 2024-12-01 MED ORDER — IOHEXOL 300 MG/ML  SOLN
65.0000 mL | Freq: Once | INTRAMUSCULAR | Status: AC | PRN
  Administered 2024-12-01: 65 mL via INTRAVENOUS

## 2024-12-01 MED ORDER — LIDOCAINE-EPINEPHRINE (PF) 2 %-1:200000 IJ SOLN
10.0000 mL | Freq: Once | INTRAMUSCULAR | Status: AC
  Administered 2024-12-01: 10 mL via INTRADERMAL
  Filled 2024-12-01: qty 20

## 2024-12-01 MED ORDER — METOPROLOL TARTRATE 25 MG PO TABS
25.0000 mg | ORAL_TABLET | Freq: Once | ORAL | Status: AC
  Administered 2024-12-01: 25 mg via ORAL
  Filled 2024-12-01: qty 1

## 2024-12-01 NOTE — ED Notes (Signed)
 [facility] called for transport.

## 2024-12-01 NOTE — Discharge Instructions (Signed)
 Return for redness drainage or if you get a fever.  Staples are typically removed between day 5 and 7.  This can be done here at urgent care at your family doctor's office.  The area can get wet but not fully immersed underwater.  No scrubbing.  If you really want to clean it you can apply a half-and-half hydrogen peroxide solution with water on a Q-tip.  You can apply an ointment a couple times a day this could be as simple as Vaseline but could also be an antibiotic ointment if you wish.  Once it is healed please try to avoid prolonged sun exposure use sunscreen.  Gells that have silicone antigens have been shown to reduce scarring in some research.

## 2024-12-01 NOTE — ED Notes (Addendum)
 ,

## 2024-12-01 NOTE — ED Notes (Signed)
 Spoke with patients son-n-law Oneil and gave update. He would like to be contacted when she is moved to a different facility.  [phone].

## 2024-12-01 NOTE — ED Notes (Signed)
 5 staples intact to posterior head. No bleeding noted at time of discharge.

## 2024-12-01 NOTE — ED Notes (Signed)
 Pt transported to imaging.

## 2024-12-01 NOTE — ED Notes (Signed)
 Provider made aware of elevated BP, order for home dose of metoprolol  placed.

## 2024-12-01 NOTE — ED Provider Notes (Signed)
 [place] EMERGENCY DEPARTMENT AT [facility] Provider Note   CSN: [id number] Arrival date & time: 12/01/24  1056     Patient presents with: Katie Leonard is a 89 y.o. female.   89 yo F with a cc of a fall.  Patient threw some trash away, and when she was coming out of her closet that she keeps a trash can she lost her balance and fell.  Struck the back of her head.  Complaining of upper back and neck pain as well.  She denies chest pain denies abdominal pain and extremity pain.   Fall       Prior to Admission medications  Medication Sig Start Date End Date Taking? Authorizing Provider  azelastine  (ASTELIN ) 0.1 % nasal spray Place 1 spray into both nostrils 2 (two) times daily.    [provider]  BACK & BODY EXTRA STRENGTH 500-32.5 MG TABS Take 1 tablet by mouth 2 (two) times daily as needed (for pain).    [provider]  Calcium  Carb-Cholecalciferol  (CALCIUM  600 + D PO) Take 1 tablet by mouth See admin instructions. Chew 1 tablet by mouth twice EVERY OTHER DAY    [provider]  clobetasol cream (TEMOVATE) 0.05 % Apply 1 Application topically 2 (two) times daily as needed (for irritation- affected sites).    [provider]  desoximetasone (TOPICORT) 0.25 % cream Apply 1 Application topically 2 (two) times daily as needed (irritation- affected sites).    [provider]  ipratropium (ATROVENT ) 0.03 % nasal spray Place 2 sprays into both nostrils every 12 (twelve) hours.    [provider]  meclizine  (ANTIVERT ) 25 MG tablet Take 6.25-25 mg by mouth 3 (three) times daily as needed for dizziness.    [provider]  metoprolol  tartrate (LOPRESSOR ) 25 MG tablet Take 12.5 mg by mouth 2 (two) times daily.    [provider]  Multiple Vitamins-Minerals (PRESERVISION AREDS 2 PO) Take 1 capsule by mouth in the morning and at bedtime.    [provider]  nitroGLYCERIN (NITROSTAT) 0.3 MG SL  tablet Place 0.3 mg under the tongue every 5 (five) minutes as needed for chest pain.    [provider]  promethazine  (PHENERGAN ) 25 MG tablet Take 1 tablet (25 mg total) by mouth every 8 (eight) hours as needed for nausea or vomiting. Patient not taking: Reported on 11/22/2023 06/05/15   Hyatt, Max T, DPM  tacrolimus (PROTOPIC) 0.1 % ointment Apply 1 Application topically See admin instructions. Apply as directed twice a day to the eyelids    [provider]  timolol  (BETIMOL ) 0.25 % ophthalmic solution Place 1 drop into both eyes 2 (two) times daily.    [provider]  travoprost , benzalkonium, (TRAVATAN ) 0.004 % ophthalmic solution Place 1 drop into both eyes at bedtime.    [provider]  triamcinolone (KENALOG) 0.025 % ointment Apply 1 Application topically 2 (two) times daily as needed (for itching).    [provider]  zolpidem  (AMBIEN ) 5 MG tablet Take 1.25 mg by mouth at bedtime.    [provider]    Allergies: Erythromycin, Other, and Pine    Review of Systems  Updated Vital Signs BP (!) 181/95   Pulse 72   Temp 98.3 F (36.8 C) (Oral)   Resp (!) 22   SpO2 98%   Physical Exam Vitals and nursing note reviewed.  Constitutional:      General: She is not in acute distress.  Appearance: She is well-developed. She is not diaphoretic.  HENT:     Head: Normocephalic.     Comments: Laceration about the occiput Eyes:     Pupils: Pupils are equal, round, and reactive to light.  Cardiovascular:     Rate and Rhythm: Normal rate and regular rhythm.     Heart sounds: No murmur heard.    No friction rub. No gallop.  Pulmonary:     Effort: Pulmonary effort is normal.     Breath sounds: No wheezing or rales.  Abdominal:     General: There is no distension.     Palpations: Abdomen is soft.     Tenderness: There is no abdominal tenderness.  Musculoskeletal:        General: No tenderness.     Cervical back: Normal range of  motion and neck supple.  Skin:    General: Skin is warm and dry.  Neurological:     Mental Status: She is alert and oriented to person, place, and time.  Psychiatric:        Behavior: Behavior normal.     (all labs ordered are listed, but only abnormal results are displayed) Labs Reviewed - No data to display  EKG: None  Radiology: CT Thoracic Spine Wo Contrast Result Date: 12/01/2024 EXAM: CT THORACIC SPINE WITHOUT CONTRAST 12/01/2024 11:50:50 AM TECHNIQUE: CT of the thoracic spine was performed without the administration of intravenous contrast. Multiplanar reformatted images are provided for review. Automated exposure control, iterative reconstruction, and/or weight based adjustment of the mA/kV was utilized to reduce the radiation dose to as low as reasonably achievable. COMPARISON: Thoracic spine CT 11/22/2023, Cervical spine CT 09/24/2024, Cervical spine CT 12/01/2024 (reported separately). CLINICAL HISTORY: 89 year old female with mid-back pain, status post fall this morning. FINDINGS: Normal thoracic spine segmentation. BONES AND ALIGNMENT: Chronically exaggerated thoracic kyphosis in the setting of multilevel chronic thoracic spinal compression fractures. Stable mild compression fractures at T1, T3, and T9. Stable moderate T6 compression fracture. Stable severe T10 compression fracture (vertebra plana). A mild to moderate T4 compression fracture is new from the CT 11/22/2023 but stable from cervical spine CT 09/24/2024. Moderate to severe T8 compression fracture is new since 11/22/2023 but sclerotic and appears nonacute. T2, T5, T7, T11, T12, and the visible upper lumbar vertebral bodies remain intact. Chronic posterior rib fractures bilaterally, and some of these are new from 11/22/2023 including healing left 7th and 8th posterolateral rib fractures. Healing left posterior 10th rib fracture also noted. No obvious acute posterior rib fracture. No acute osseous abnormality identified in  the thoracic spine. DEGENERATIVE CHANGES: Multilevel chronic thoracic spinal compression fractures are present, as detailed in the BONES AND ALIGNMENT section. SOFT TISSUES: Thoracic paraspinal soft tissues remain within normal limits. Advanced chronic aortic calcified atherosclerosis. Negative visible non-contrast upper abdominal viscera. No pericardial or pleural effusion. Chronic severe emphysema. Stable confluent and partially calcified apical lung scarring. Stable chronic architectural distortion lateral to the left hilum on series 3 image 53. IMPRESSION: 1. Progression of non-acute appearing thoracic vertebral body and posterior rib fractures since 2024 Thoracic CT. But No acute osseous abnormality identified in the thoracic spine. 2. Advanced emphysema and calcified aortic atherosclerosis. Electronically signed by: Helayne Hurst MD 12/01/2024 12:12 PM EST RP Workstation: [id number]   CT Cervical Spine Wo Contrast Result Date: 12/01/2024 EXAM: CT CERVICAL SPINE WITHOUT CONTRAST 12/01/2024 11:50:50 AM TECHNIQUE: CT of the cervical spine was performed without the administration of intravenous contrast. Multiplanar reformatted images are provided for review. Automated exposure  control, iterative reconstruction, and/or weight based adjustment of the mA/kV was utilized to reduce the radiation dose to as low as reasonably achievable. COMPARISON: Head CT reported separately today. Cervical spine CT 09/24/2024. CLINICAL HISTORY: Patient is a 89 year old female. Status post fall this morning striking the back of the head. FINDINGS: BONES AND ALIGNMENT: Stable cervical lordosis. Multilevel degenerative spondylolisthesis from C5-C6 to C7-T1. Degenerative ankylosis of the C2-C3 posterior elements redemonstrated. Chronic very severe C1-C2 degeneration asymmetric to the left. Stable visible upper thoracic levels including chronic appearing compression fractures of T1, T3, T4. No acute fracture or traumatic malalignment.  DEGENERATIVE CHANGES: Chronic severe disc and endplate degeneration at C5-C6 and C6-C7 including vacuum disc. Chronic lower cervical spinal stenosis suspected and appears stable by CT. SOFT TISSUES: Bulky left carotid calcified atherosclerosis. Chronic confluent and partially calcified apical lung scarring. Partially visible upper lung emphysema appears moderate to severe. No prevertebral soft tissue swelling. IMPRESSION: 1. No acute traumatic injury identified in the cervical spine. 2. Stable advanced chronic cervical spine degeneration superimposed on C2-C3 degenerative ankylosis. 3. Chronic upper thoracic compression fractures, lung scarring, emphysema. Electronically signed by: Helayne Hurst MD 12/01/2024 12:04 PM EST RP Workstation: [id number]   CT Head Wo Contrast Result Date: 12/01/2024 EXAM: CT HEAD WITHOUT 12/01/2024 11:50:50 AM TECHNIQUE: CT of the head was performed without the administration of intravenous contrast. Automated exposure control, iterative reconstruction, and/or weight based adjustment of the mA/kV was utilized to reduce the radiation dose to as low as reasonably achievable. COMPARISON: Head CT 09/24/2024. CLINICAL HISTORY: 89 year old female status post fall this morning striking the back of the head, with pain. FINDINGS: BRAIN AND VENTRICLES: No acute intracranial hemorrhage. No mass effect or midline shift. No extra-axial fluid collection. No evidence of acute infarct. No hydrocephalus. Stable brain volume. Patchy and confluent chronic bilateral cerebral white matter hypodensity appears stable. Small area of chronic encephalomalacia in the medial left parietal lobe is stable. Stable gray white differentiation. Calcified atherosclerosis at the skull base. No suspicious intracranial vascular hyperdensity. ORBITS: No acute abnormality. SINUSES AND MASTOIDS: Chronic right maxillary sinusitis with mucoperiosteal thickening. Other paranasal sinuses, tympanic cavities, and mastoids remain  well aerated. SOFT TISSUES AND SKULL: Broad based posterior scalp soft tissue swelling just above the occipital protuberance. Trace skin defect there. Underlying skull appears stable and intact. No acute skull fracture. IMPRESSION: 1. Posterior scalp soft tissue injury. 2. No acute intracranial abnormality. Electronically signed by: Helayne Hurst MD 12/01/2024 12:00 PM EST RP Workstation: [id number]     .Laceration Repair  Date/Time: 12/01/2024 12:54 PM  Performed by: Emil Share, DO Authorized by: Emil Share, DO   Consent:    Consent obtained:  Verbal   Consent given by:  Patient   Risks, benefits, and alternatives were discussed: yes     Risks discussed:  Infection, pain, poor cosmetic result and poor wound healing   Alternatives discussed:  No treatment Universal protocol:    Procedure explained and questions answered to patient or proxy's satisfaction: yes     Immediately prior to procedure, a time out was called: yes     Patient identity confirmed:  Verbally with patient Laceration details:    Location:  Scalp   Scalp location:  Occipital   Length (cm):  8.2 Pre-procedure details:    Preparation:  Patient was prepped and draped in usual sterile fashion Exploration:    Hemostasis achieved with:  Epinephrine  and direct pressure   Imaging obtained comment:  CT   Imaging outcome: foreign body not noted  Wound exploration: entire depth of wound visualized     Wound extent: no underlying fracture   Treatment:    Area cleansed with:  Chlorhexidine   Amount of cleaning:  Standard   Irrigation solution:  Sterile saline   Irrigation volume:  50   Irrigation method:  Pressure wash   Debridement:  None   Undermining:  None   Scar revision: no   Skin repair:    Repair method:  Staples   Number of staples:  5 Approximation:    Approximation:  Close Repair type:    Repair type:  Simple Post-procedure details:    Dressing:  Antibiotic ointment and adhesive bandage   Procedure  completion:  Tolerated well, no immediate complications    Medications Ordered in the ED  lidocaine -EPINEPHrine  (XYLOCAINE  W/EPI) 2 %-1:200000 (PF) injection 10 mL (10 mLs Intradermal Given by Other 12/01/24 1157)  acetaminophen  (TYLENOL ) tablet 1,000 mg (1,000 mg Oral Given 12/01/24 1220)                                    Medical Decision Making Amount and/or Complexity of Data Reviewed Radiology: ordered.  Risk OTC drugs. Prescription drug management.   89 yo F with a chief complaints of a scalp laceration after fall.  Complaining of upper back pain as well.  CT imaging of the head C-spine and T-spine without obvious acute finding.  Wound was repaired at bedside.  PCP follow-up.  12:55 PM:  I have discussed the diagnosis/risks/treatment options with the patient.  Evaluation and diagnostic testing in the emergency department does not suggest an emergent condition requiring admission or immediate intervention beyond what has been performed at this time.  They will follow up with PCP. We also discussed returning to the ED immediately if new or worsening sx occur. We discussed the sx which are most concerning (e.g., sudden worsening pain, fever, inability to tolerate by mouth) that necessitate immediate return. Medications administered to the patient during their visit and any new prescriptions provided to the patient are listed below.  Medications given during this visit Medications  lidocaine -EPINEPHrine  (XYLOCAINE  W/EPI) 2 %-1:200000 (PF) injection 10 mL (10 mLs Intradermal Given by Other 12/01/24 1157)  acetaminophen  (TYLENOL ) tablet 1,000 mg (1,000 mg Oral Given 12/01/24 1220)     The patient appears reasonably screen and/or stabilized for discharge and I doubt any other medical condition or other [facility] requiring further screening, evaluation, or treatment in the ED at this time prior to discharge.       Final diagnoses:  Scalp laceration, initial encounter  Fall, initial  encounter    ED Discharge Orders     None          Emil Share, DO 12/01/24 1255

## 2024-12-01 NOTE — ED Triage Notes (Signed)
 BIB [facility] from [facility]. Mechanical fall this morning. Hit back of head. Denies LOC. Denies Thinners. Small lack to back of head. C/o pain between shoulder blades

## 2024-12-01 NOTE — ED Provider Notes (Signed)
 Physical Exam  BP (!) 218/95   Pulse 72   Temp 98.6 F (37 C)   Resp 19   SpO2 91%   Physical Exam  Procedures  Procedures  ED Course / MDM   Clinical Course as of 12/01/24 1810  Sat Dec 01, 2024  1757 BP(!): 218/95 [JL]    Clinical Course User Index [JL] Jerrol Agent, MD   Medical Decision Making Amount and/or Complexity of Data Reviewed Labs: ordered. Radiology: ordered.  Risk OTC drugs. Prescription drug management. Decision regarding hospitalization.   I was called to reassess the patient bedside as she was remaining persistently hypertensive despite her home blood pressure medication being administered in the emergency department.  Please see the prior ER provider's note for previous MDM.  The patient fell at home sustaining a laceration to her posterior scalp which was repaired with 5 staples.  She had CT imaging of the head cervical spine and thoracic spine which showed chronic appearing fractures.  On my evaluation, the patient endorses pain in her neck.  CT imaging appears to show progression of nonacute appearing compression fractures in the thoracic spine.  Patient has not been able to safely ambulate and currently does not reside in any form of assisted living.  Suspect hypertension in the setting of uncontrolled acute on chronic pain given the patient's chronic fractures.  Will initiate workup for hypertensive emergency to include EKG, chest x-ray and laboratory evaluation.    EKG: Sinus rhythm, no acute ischemic changes  Chest x-ray: IMPRESSION:  1. Multiple right-sided rib fractures, favored to be acute/subacute.  2. No acute cardiopulmonary abnormality.   Patient in acute pain, will obtain CT chest abdomen pelvis to further evaluate  Labs: Cardiac troponin less than 15, CBC with a nonspecific leukocytosis 10.7, hemoglobin normal, BMP unremarkable.  Patient feeling symptomatically improved following oxycodone  orally.  CT C/A/P: IMPRESSION:  1.  Acute appearing nondisplaced manubrial fracture with small anteromediastinal  hematoma.  2. Severe emphysema. Pulmonary emphysema is an independent risk factor for lung  cancer; recommend consideration for evaluation for a low-dose CT lung cancer  screening program.  3. Left upper lobe ground-glass opacity measuring 3.5 x 4.0 cm; recommend  short-interval chest CT in 3-6 months to assess for persistence or evolution,  with consideration of further evaluation if a solid component develops.  4. L2 compression fracture, favored as chronic but new from the prior  examination.   Spoke with Dr. Rubin of general surgery given the patient's acute nondisplaced manubrial fracture with small anterior mediastinal hematoma.  He stated if patient pain is well-controlled and patient is able to pull on incentive spirometry that she can be discharged but otherwise to admit to the medicine service.  Pt only was able to pull 600cc on incentive spirometry.   Given the patient's fall, manubrium fracture, poor IS, medicine was consulted for admission for pain control and further management.  [place] with the healthcare power of attorney, the patient's daughter, [employer] at [phone] and discussed the presentation of the patient and poor incentive spirometry in the setting of a new manubrium fracture.  She agreed with plan for admission at this time as the patient currently resides at an assisted living facility.  She has not been able to ambulate at her baseline per staff.  She is in severe pain after ambulatory trial, we will plan to admit for pain control and further work with PT/OT, hospitalist medicine consulted for admission, Dr. Alfornia accepting.     Jerrol Agent,  MD 12/01/24 2315

## 2024-12-01 NOTE — Plan of Care (Signed)
 Plan of Care Note for accepted transfer   Patient name: Katie Leonard FMW:[id number] DOB: November 10, 1925  Facility requesting transfer: [facility] ED Requesting Provider: Dr. Jerrol Facility course: 89 year old female with history of colon cancer, hypertension, glaucoma, insomnia, hyperlipidemia presenting for evaluation of head injury and upper back and neck pain secondary to a mechanical fall today.  Laceration to posterior scalp repaired with 5 staples.  CT head negative for acute intracranial abnormality.  CT C-spine negative for acute traumatic injury.  CT of thoracic spine showing progression of nonacute appearing thoracic vertebral body and posterior rib fractures since 2024 but no acute osseous abnormality identified.  CT chest abdomen pelvis showing acute appearing nondisplaced manubrial fracture with small anteriomediastinal hematoma.  Showing L2 compression fracture favored as chronic but new from prior examination.  There are additional important incidental findings findings on CT (please read full report).  Blood pressure elevated in the ED with SBP up to 200s, most recent in the 160s.  Patient was given Tylenol , metoprolol , and oxycodone .  WBC count 10.7, hemoglobin 13.6, troponin negative. EDP discussed case with Dr. Rubin with general surgery regarding manubrial fracture and mediastinal hematoma.  General surgery felt that if the patient's pain is well-controlled and she is able to have good effort on incentive spirometry she could be discharged.  However, patient noted to have poor effort on incentive spirometry, failed ambulation, and admission requested for pain control and further management.  Plan of care: The patient is accepted for admission to Progressive unit at [facility].  [facility] will assume care on arrival to accepting facility. Until arrival, care as per EDP. However, [facility] available 24/7 for questions and assistance.  Check [email or web address] for on-call coverage.  Nursing  staff, please call [facility] Admits & Consults System-Wide number under Amion on patient's arrival so appropriate admitting provider can evaluate the pt.

## 2024-12-02 ENCOUNTER — Encounter (HOSPITAL_COMMUNITY): Payer: Self-pay | Admitting: Internal Medicine

## 2024-12-02 ENCOUNTER — Other Ambulatory Visit (HOSPITAL_COMMUNITY): Payer: Self-pay

## 2024-12-02 DIAGNOSIS — G9341 Metabolic encephalopathy: Secondary | ICD-10-CM

## 2024-12-02 DIAGNOSIS — Z85038 Personal history of other malignant neoplasm of large intestine: Secondary | ICD-10-CM | POA: Insufficient documentation

## 2024-12-02 DIAGNOSIS — W19XXXA Unspecified fall, initial encounter: Secondary | ICD-10-CM

## 2024-12-02 DIAGNOSIS — R42 Dizziness and giddiness: Secondary | ICD-10-CM | POA: Diagnosis not present

## 2024-12-02 DIAGNOSIS — Y92009 Unspecified place in unspecified non-institutional (private) residence as the place of occurrence of the external cause: Secondary | ICD-10-CM

## 2024-12-02 DIAGNOSIS — R0603 Acute respiratory distress: Secondary | ICD-10-CM

## 2024-12-02 DIAGNOSIS — E785 Hyperlipidemia, unspecified: Secondary | ICD-10-CM | POA: Insufficient documentation

## 2024-12-02 DIAGNOSIS — S2221XA Fracture of manubrium, initial encounter for closed fracture: Secondary | ICD-10-CM | POA: Diagnosis present

## 2024-12-02 DIAGNOSIS — I10 Essential (primary) hypertension: Secondary | ICD-10-CM | POA: Diagnosis not present

## 2024-12-02 DIAGNOSIS — R52 Pain, unspecified: Secondary | ICD-10-CM

## 2024-12-02 LAB — CBC
HCT: 39.6 % (ref 36.0–46.0)
Hemoglobin: 13.1 g/dL (ref 12.0–15.0)
MCH: 33.2 pg (ref 26.0–34.0)
MCHC: 33.1 g/dL (ref 30.0–36.0)
MCV: 100.3 fL — ABNORMAL HIGH (ref 80.0–100.0)
Platelets: 225 K/uL (ref 150–400)
RBC: 3.95 MIL/uL (ref 3.87–5.11)
RDW: 13.4 % (ref 11.5–15.5)
WBC: 10.8 K/uL — ABNORMAL HIGH (ref 4.0–10.5)
nRBC: 0 % (ref 0.0–0.2)

## 2024-12-02 LAB — COMPREHENSIVE METABOLIC PANEL WITH GFR
ALT: 23 U/L (ref 0–44)
AST: 38 U/L (ref 15–41)
Albumin: 3.4 g/dL — ABNORMAL LOW (ref 3.5–5.0)
Alkaline Phosphatase: 79 U/L (ref 38–126)
Anion gap: 11 (ref 5–15)
BUN: 13 mg/dL (ref 8–23)
CO2: 21 mmol/L — ABNORMAL LOW (ref 22–32)
Calcium: 8.4 mg/dL — ABNORMAL LOW (ref 8.9–10.3)
Chloride: 104 mmol/L (ref 98–111)
Creatinine, Ser: 0.65 mg/dL (ref 0.44–1.00)
GFR, Estimated: >60 mL/min (ref >60)
Glucose, Bld: 126 mg/dL — ABNORMAL HIGH (ref 70–99)
Potassium: 4.1 mmol/L (ref 3.5–5.1)
Sodium: 136 mmol/L (ref 135–145)
Total Bilirubin: 1 mg/dL (ref 0.0–1.2)
Total Protein: 6.4 g/dL — ABNORMAL LOW (ref 6.5–8.1)

## 2024-12-02 MED ORDER — SODIUM CHLORIDE 0.9% FLUSH
3.0000 mL | Freq: Two times a day (BID) | INTRAVENOUS | Status: DC
  Administered 2024-12-02: 3 mL via INTRAVENOUS

## 2024-12-02 MED ORDER — DOCUSATE SODIUM 100 MG PO CAPS
100.0000 mg | ORAL_CAPSULE | Freq: Two times a day (BID) | ORAL | Status: DC
  Administered 2024-12-02 (×2): 100 mg via ORAL
  Filled 2024-12-02 (×3): qty 1

## 2024-12-02 MED ORDER — SODIUM CHLORIDE 0.9% FLUSH: 3.0000 mL | INTRAVENOUS | Status: DC | PRN

## 2024-12-02 MED ORDER — HYDRALAZINE HCL 20 MG/ML IJ SOLN
10.0000 mg | Freq: Four times a day (QID) | INTRAMUSCULAR | Status: DC | PRN
  Administered 2024-12-02 – 2024-12-03 (×3): 10 mg via INTRAVENOUS
  Filled 2024-12-02 (×3): qty 1

## 2024-12-02 MED ORDER — OXYCODONE HCL 5 MG PO TABS: 5.0000 mg | ORAL_TABLET | Freq: Four times a day (QID) | ORAL | Status: DC | PRN

## 2024-12-02 MED ORDER — METOPROLOL TARTRATE 12.5 MG HALF TABLET
12.5000 mg | ORAL_TABLET | Freq: Two times a day (BID) | ORAL | Status: DC
  Administered 2024-12-02: 12.5 mg via ORAL
  Filled 2024-12-02: qty 1

## 2024-12-02 MED ORDER — MECLIZINE HCL 12.5 MG PO TABS: 12.5000 mg | ORAL_TABLET | Freq: Three times a day (TID) | ORAL | Status: DC | PRN

## 2024-12-02 MED ORDER — METOPROLOL TARTRATE 25 MG PO TABS
25.0000 mg | ORAL_TABLET | Freq: Two times a day (BID) | ORAL | Status: DC
  Administered 2024-12-02 – 2024-12-04 (×4): 25 mg via ORAL
  Filled 2024-12-02 (×4): qty 1

## 2024-12-02 MED ORDER — ACETAMINOPHEN 650 MG RE SUPP: 650.0000 mg | Freq: Four times a day (QID) | RECTAL | Status: DC | PRN

## 2024-12-02 MED ORDER — SODIUM CHLORIDE 0.9 % IV SOLN: 250.0000 mL | INTRAVENOUS | Status: AC | PRN

## 2024-12-02 MED ORDER — OYSTER SHELL CALCIUM/D3 500-5 MG-MCG PO TABS
1.0000 | ORAL_TABLET | Freq: Every day | ORAL | Status: DC
  Administered 2024-12-02 – 2024-12-04 (×3): 1 via ORAL
  Filled 2024-12-02 (×4): qty 1

## 2024-12-02 MED ORDER — ONDANSETRON HCL 4 MG PO TABS: 4.0000 mg | ORAL_TABLET | Freq: Four times a day (QID) | ORAL | Status: DC | PRN

## 2024-12-02 MED ORDER — HALOPERIDOL LACTATE 5 MG/ML IJ SOLN
1.0000 mg | Freq: Four times a day (QID) | INTRAMUSCULAR | Status: DC | PRN
  Administered 2024-12-02: 1 mg via INTRAVENOUS
  Filled 2024-12-02: qty 1

## 2024-12-02 MED ORDER — ONDANSETRON HCL 4 MG/2ML IJ SOLN
4.0000 mg | Freq: Four times a day (QID) | INTRAMUSCULAR | Status: DC | PRN
  Administered 2024-12-02: 4 mg via INTRAVENOUS
  Filled 2024-12-02: qty 2

## 2024-12-02 MED ORDER — OXYCODONE HCL 5 MG PO TABS: 2.5000 mg | ORAL_TABLET | Freq: Four times a day (QID) | ORAL | Status: DC | PRN

## 2024-12-02 MED ORDER — ACETAMINOPHEN 325 MG PO TABS: 650.0000 mg | ORAL_TABLET | Freq: Four times a day (QID) | ORAL | Status: DC | PRN

## 2024-12-02 MED ORDER — MORPHINE SULFATE (PF) 2 MG/ML IV SOLN: 1.0000 mg | INTRAVENOUS | Status: DC | PRN

## 2024-12-02 MED ORDER — SODIUM CHLORIDE 0.9% FLUSH
3.0000 mL | Freq: Two times a day (BID) | INTRAVENOUS | Status: DC
  Administered 2024-12-02 – 2024-12-04 (×5): 3 mL via INTRAVENOUS

## 2024-12-02 NOTE — Progress Notes (Signed)
 SATURATION QUALIFICATIONS: (This note is used to comply with regulatory documentation for home oxygen)  Patient Saturations on Room Air at Rest = 94%  Patient Saturations on Room Air while Ambulating = 92%   Please briefly explain why patient needs home oxygen: Pt is not currently requiring supplemental O2.   Theo Ferretti, PT, DPT Acute Rehabilitation Services  Office: [phone]

## 2024-12-02 NOTE — H&P (Signed)
 History and Physical    Katie Leonard FMW:[id number] DOB: October 10, 1925 DOA: 12/01/2024  PCP: Sophronia Ozell BROCKS, MD   Patient coming from: Home   Chief Complaint:  Chief Complaint  Patient presents with   Fall   ED TRIAGE note:IB [facility] from [facility]. Mechanical fall this morning. Hit back of head. Denies LOC. Denies Thinners. Small lack to back of head. C/o pain between shoulder blades   HPI:  Katie Leonard is a 89 y.o. female with medical history significant of colon cancer, hypertension, glaucoma, insomnia, chronic vertigo and hyperlipidemia presenting for evaluation of head injury and upper back and neck pain secondary to a mechanical fall today.  Patient reported that threw some trash away, and when she was coming out of her closet that she keeps a trash can she lost her balance and fell. Struck the back of her head. Complaining of upper back and neck pain as well. She denies chest pain, abdominal pain and bilateral upper/lower extremities pain.   At presentation to ED patient found borderline hypertensive otherwise hemodynamically stable. While in the ED laceration to posterior scalp repaired with 5 staples.   CT head negative for acute intracranial abnormality.   CT C-spine negative for acute traumatic injury.    CT of thoracic spine showing progression of nonacute appearing thoracic vertebral body and posterior rib fractures since 2024 but no acute osseous abnormality identified.     CT chest abdomen pelvis showing: 1. Acute appearing nondisplaced manubrial fracture with small anteromediastinal hematoma. 2. Severe emphysema. Pulmonary emphysema is an independent risk factor for lung cancer; recommend consideration for evaluation for a low-dose CT lung cancer screening program. 3. Left upper lobe ground-glass opacity measuring 3.5 x 4.0 cm; recommend short-interval chest CT in 3-6 months to assess for persistence or evolution, with consideration of further evaluation if a solid  component develops. 4. L2 compression fracture, favored as chronic but new from the prior examination.   Blood pressure elevated in the ED with SBP up to 200s, most recent in the 160s.  Patient was given Tylenol , metoprolol , and oxycodone .  WBC count 10.7, hemoglobin 13.6, troponin negative.   EDP discussed case with Dr. Rubin with general surgery regarding manubrial fracture and mediastinal hematoma.  General surgery felt that if the patient's pain is well-controlled and she is able to have good effort on incentive spirometry she could be discharged.  However, patient noted to have poor effort on incentive spirometry, failed ambulation, and admission requested for pain control and further management.   In the ED patient received Tylenol , Lopressor  and oxycodone .  Significant labs in the ED: Lab Orders         CBC with Differential         Basic metabolic panel         CBC         Comprehensive metabolic panel       Review of Systems:  Review of Systems  Constitutional:  Negative for weight loss.  Respiratory:  Negative for shortness of breath.   Cardiovascular:  Negative for chest pain, palpitations and leg swelling.  Gastrointestinal:  Negative for abdominal pain.  Musculoskeletal:  Positive for falls and joint pain. Negative for back pain and neck pain.  Neurological:  Negative for dizziness, loss of consciousness, weakness and headaches.  Psychiatric/Behavioral:  The patient is not nervous/anxious.     Past Medical History:  Diagnosis Date   Colon cancer (HCC)    Glaucoma    Hypertension  Insomnia     History reviewed. No pertinent surgical history.   reports that she quit smoking about 46 years ago. She does not have any smokeless tobacco history on file. No history on file for alcohol use and drug use.  Allergies[1]  History reviewed. No pertinent family history.  Prior to Admission medications  Medication Sig Start Date End Date Taking? Authorizing Provider   azelastine  (ASTELIN ) 0.1 % nasal spray Place 1 spray into both nostrils 2 (two) times daily.    [provider]  BACK & BODY EXTRA STRENGTH 500-32.5 MG TABS Take 1 tablet by mouth 2 (two) times daily as needed (for pain).    [provider]  Calcium  Carb-Cholecalciferol  (CALCIUM  600 + D PO) Take 1 tablet by mouth See admin instructions. Chew 1 tablet by mouth twice EVERY OTHER DAY    [provider]  clobetasol cream (TEMOVATE) 0.05 % Apply 1 Application topically 2 (two) times daily as needed (for irritation- affected sites).    [provider]  desoximetasone (TOPICORT) 0.25 % cream Apply 1 Application topically 2 (two) times daily as needed (irritation- affected sites).    [provider]  ipratropium (ATROVENT ) 0.03 % nasal spray Place 2 sprays into both nostrils every 12 (twelve) hours.    [provider]  meclizine  (ANTIVERT ) 25 MG tablet Take 6.25-25 mg by mouth 3 (three) times daily as needed for dizziness.    [provider]  metoprolol  tartrate (LOPRESSOR ) 25 MG tablet Take 12.5 mg by mouth 2 (two) times daily.    [provider]  Multiple Vitamins-Minerals (PRESERVISION AREDS 2 PO) Take 1 capsule by mouth in the morning and at bedtime.    [provider]  nitroGLYCERIN (NITROSTAT) 0.3 MG SL tablet Place 0.3 mg under the tongue every 5 (five) minutes as needed for chest pain.    [provider]  promethazine  (PHENERGAN ) 25 MG tablet Take 1 tablet (25 mg total) by mouth every 8 (eight) hours as needed for nausea or vomiting. Patient not taking: Reported on 11/22/2023 06/05/15   Hyatt, Max T, DPM  tacrolimus (PROTOPIC) 0.1 % ointment Apply 1 Application topically See admin instructions. Apply as directed twice a day to the eyelids    [provider]  timolol  (BETIMOL ) 0.25 % ophthalmic solution Place 1 drop into both eyes 2 (two) times daily.    [provider]  travoprost , benzalkonium,  (TRAVATAN ) 0.004 % ophthalmic solution Place 1 drop into both eyes at bedtime.    [provider]  triamcinolone (KENALOG) 0.025 % ointment Apply 1 Application topically 2 (two) times daily as needed (for itching).    [provider]  zolpidem  (AMBIEN ) 5 MG tablet Take 1.25 mg by mouth at bedtime.    [provider]     Physical Exam: Vitals:   12/02/24 0245 12/02/24 0400 12/02/24 0538 12/02/24 0550  BP: (!) 151/93 (!) 179/97 (!) 180/89 (!) 164/82  Pulse:  76 72 74  Resp: 20 20 20 20   Temp:  98.7 F (37.1 C)    TempSrc:  Oral    SpO2:  94% 95% 95%  Weight:  39.3 kg    Height:  5' 3 (1.6 m)      Physical Exam Constitutional:      General: She is not in acute distress.    Appearance: Normal appearance. She is not ill-appearing.  HENT:     Head: Normocephalic.     Mouth/Throat:     Mouth: Mucous membranes  are moist.  Eyes:     Pupils: Pupils are equal, round, and reactive to light.  Cardiovascular:     Rate and Rhythm: Normal rate and regular rhythm.     Pulses: Normal pulses.     Heart sounds: Normal heart sounds.  Pulmonary:     Effort: Pulmonary effort is normal.     Breath sounds: Normal breath sounds.  Abdominal:     Palpations: Abdomen is soft.  Musculoskeletal:     Cervical back: Neck supple.     Right lower leg: No edema.     Left lower leg: No edema.  Skin:    Capillary Refill: Capillary refill takes less than 2 seconds.  Neurological:     Mental Status: She is alert.  Psychiatric:        Mood and Affect: Mood normal.      Labs on Admission: I have personally reviewed following labs and imaging studies  CBC: Recent Labs  Lab 12/01/24 1805  WBC 10.7*  NEUTROABS 8.2*  HGB 13.6  HCT 41.4  MCV 101.0*  PLT 232   Basic Metabolic Panel: Recent Labs  Lab 12/01/24 1805  NA 140  K 4.1  CL 104  CO2 28  GLUCOSE 107*  BUN 16  CREATININE 0.56  CALCIUM  9.3   GFR: Estimated Creatinine Clearance: 24.9 mL/min (by C-G  formula based on SCr of 0.56 mg/dL). Liver Function Tests: No results for input(s): AST, ALT, ALKPHOS, BILITOT, PROT, ALBUMIN in the last 168 hours. No results for input(s): LIPASE, AMYLASE in the last 168 hours. No results for input(s): AMMONIA in the last 168 hours. Coagulation Profile: No results for input(s): INR, PROTIME in the last 168 hours. Cardiac Enzymes: No results for input(s): CKTOTAL, CKMB, CKMBINDEX, TROPONINI, TROPONINIHS in the last 168 hours. BNP (last 3 results) No results for input(s): BNP in the last 8760 hours. HbA1C: No results for input(s): HGBA1C in the last 72 hours. CBG: No results for input(s): GLUCAP in the last 168 hours. Lipid Profile: No results for input(s): CHOL, HDL, LDLCALC, TRIG, CHOLHDL, LDLDIRECT in the last 72 hours. Thyroid Function Tests: No results for input(s): TSH, T4TOTAL, FREET4, T3FREE, THYROIDAB in the last 72 hours. Anemia Panel: No results for input(s): VITAMINB12, FOLATE, FERRITIN, TIBC, IRON, RETICCTPCT in the last 72 hours. Urine analysis:    Component Value Date/Time   COLORURINE YELLOW 11/22/2023 1818   APPEARANCEUR CLEAR 11/22/2023 1818   LABSPEC 1.011 11/22/2023 1818   PHURINE 6.0 11/22/2023 1818   GLUCOSEU NEGATIVE 11/22/2023 1818   HGBUR NEGATIVE 11/22/2023 1818   BILIRUBINUR NEGATIVE 11/22/2023 1818   KETONESUR 20 (A) 11/22/2023 1818   PROTEINUR NEGATIVE 11/22/2023 1818   NITRITE NEGATIVE 11/22/2023 1818   LEUKOCYTESUR SMALL (A) 11/22/2023 1818    Radiological Exams on Admission: I have personally reviewed images CT CHEST ABDOMEN PELVIS W CONTRAST Result Date: 12/01/2024 EXAM: CT CHEST, ABDOMEN AND PELVIS WITH CONTRAST 12/01/2024 07:24:47 PM TECHNIQUE: CT of the chest, abdomen and pelvis was performed with the administration of 65 mL of iohexol  (OMNIPAQUE ) 300 MG/ML solution. Multiplanar reformatted images are provided for review. Automated  exposure control, iterative reconstruction, and/or weight based adjustment of the mA/kV was utilized to reduce the radiation dose to as low as reasonably achievable. COMPARISON: None available. CLINICAL HISTORY: Polytrauma, blunt. FINDINGS: CHEST: MEDIASTINUM AND LYMPH NODES: The heart is mildly enlarged. There are atherosclerotic calcifications of the aorta and coronary arteries. There are low attenuation nodules in the thyroid gland measuring up to 7 mm.  The central airways are clear. No mediastinal, hilar or axillary lymphadenopathy. LUNGS AND PLEURA: There are biapical calcified pleural plaques. There are trace bilateral pleural effusions. There is pleuroparenchymal scarring and blunt lung apices. There is severe emphysema. There is a focal ground glass opacity in the left upper lobe measuring 3.5 x 4.0 cm. No pneumothorax. ABDOMEN AND PELVIS: LIVER: The liver is unremarkable. GALLBLADDER AND BILE DUCTS: Gallbladder is unremarkable. No biliary ductal dilatation. SPLEEN: No acute abnormality. PANCREAS: No acute abnormality. ADRENAL GLANDS: No acute abnormality. KIDNEYS, URETERS AND BLADDER: Small left peripelvic cysts are present in the left kidney. No stones in the kidneys or ureters. No hydronephrosis. No perinephric or periureteral stranding. Urinary bladder is unremarkable. GI AND BOWEL: Stomach demonstrates no acute abnormality. Rectosigmoid anastomosis is present. There is a large amount of stool throughout the entire colon. The appendix is not visualized. There is no bowel obstruction. REPRODUCTIVE ORGANS: No acute abnormality. PERITONEUM AND RETROPERITONEUM: No ascites. No free air. VASCULATURE: Aorta is normal in caliber. There are atherosclerotic calcifications of the abdominal aorta. ABDOMINAL AND PELVIS LYMPH NODES: No lymphadenopathy. BONES AND SOFT TISSUES: There is acute appearing nondisplaced manubrial fracture with some anteromediastinal hematoma. There is chronic appearing mid sternal fracture  with callus formation. The bones are diffusely osteopenic. There are degenerative changes of both shoulders. There are healed bilateral rib fractures. Posterolateral left 7th and 8th rib fractures are subacute. Impression deformities of T4, T6, T8, T9 and T10 are present. Please see dedicated CT of the thoracic spine for further description. L2 compression fracture is present, favored as chronic but new from the prior examination. Posterior fusion hardware is seen at L4-L5. No focal soft tissue abnormality. IMPRESSION: 1. Acute appearing nondisplaced manubrial fracture with small anteromediastinal hematoma. 2. Severe emphysema. Pulmonary emphysema is an independent risk factor for lung cancer; recommend consideration for evaluation for a low-dose CT lung cancer screening program. 3. Left upper lobe ground-glass opacity measuring 3.5 x 4.0 cm; recommend short-interval chest CT in 3-6 months to assess for persistence or evolution, with consideration of further evaluation if a solid component develops. 4. L2 compression fracture, favored as chronic but new from the prior examination. Electronically signed by: Greig Pique MD 12/01/2024 07:51 PM EST RP Workstation: [id number]   DG Chest Portable 1 View Result Date: 12/01/2024 EXAM: 1 VIEW(S) XRAY OF THE CHEST 12/01/2024 06:19:00 PM COMPARISON: 11/22/2023 CLINICAL HISTORY: uncontrolled HTN FINDINGS: LUNGS AND PLEURA: Hyperinflation. Chronic interstitial changes. No focal pulmonary opacity. No pleural effusion. No pneumothorax. HEART AND MEDIASTINUM: Calcified and tortuous aorta, including along the left lower hemithorax. No acute abnormality of the cardiac and mediastinal silhouettes. BONES AND SOFT TISSUES: Compression deformities in the lower thoracic spine. Multiple bilateral rib fractures, chronic on the left but favored to be acute/subacute on the right. IMPRESSION: 1. Multiple right-sided rib fractures, favored to be acute/subacute. 2. No acute cardiopulmonary  abnormality. Electronically signed by: Pinkie Pebbles MD 12/01/2024 06:28 PM EST RP Workstation: [id number]   CT Thoracic Spine Wo Contrast Result Date: 12/01/2024 EXAM: CT THORACIC SPINE WITHOUT CONTRAST 12/01/2024 11:50:50 AM TECHNIQUE: CT of the thoracic spine was performed without the administration of intravenous contrast. Multiplanar reformatted images are provided for review. Automated exposure control, iterative reconstruction, and/or weight based adjustment of the mA/kV was utilized to reduce the radiation dose to as low as reasonably achievable. COMPARISON: Thoracic spine CT 11/22/2023, Cervical spine CT 09/24/2024, Cervical spine CT 12/01/2024 (reported separately). CLINICAL HISTORY: 89 year old female with mid-back pain, status post fall this morning. FINDINGS: Normal  thoracic spine segmentation. BONES AND ALIGNMENT: Chronically exaggerated thoracic kyphosis in the setting of multilevel chronic thoracic spinal compression fractures. Stable mild compression fractures at T1, T3, and T9. Stable moderate T6 compression fracture. Stable severe T10 compression fracture (vertebra plana). A mild to moderate T4 compression fracture is new from the CT 11/22/2023 but stable from cervical spine CT 09/24/2024. Moderate to severe T8 compression fracture is new since 11/22/2023 but sclerotic and appears nonacute. T2, T5, T7, T11, T12, and the visible upper lumbar vertebral bodies remain intact. Chronic posterior rib fractures bilaterally, and some of these are new from 11/22/2023 including healing left 7th and 8th posterolateral rib fractures. Healing left posterior 10th rib fracture also noted. No obvious acute posterior rib fracture. No acute osseous abnormality identified in the thoracic spine. DEGENERATIVE CHANGES: Multilevel chronic thoracic spinal compression fractures are present, as detailed in the BONES AND ALIGNMENT section. SOFT TISSUES: Thoracic paraspinal soft tissues remain within normal limits.  Advanced chronic aortic calcified atherosclerosis. Negative visible non-contrast upper abdominal viscera. No pericardial or pleural effusion. Chronic severe emphysema. Stable confluent and partially calcified apical lung scarring. Stable chronic architectural distortion lateral to the left hilum on series 3 image 53. IMPRESSION: 1. Progression of non-acute appearing thoracic vertebral body and posterior rib fractures since 2024 Thoracic CT. But No acute osseous abnormality identified in the thoracic spine. 2. Advanced emphysema and calcified aortic atherosclerosis. Electronically signed by: Helayne Hurst MD 12/01/2024 12:12 PM EST RP Workstation: [id number]   CT Cervical Spine Wo Contrast Result Date: 12/01/2024 EXAM: CT CERVICAL SPINE WITHOUT CONTRAST 12/01/2024 11:50:50 AM TECHNIQUE: CT of the cervical spine was performed without the administration of intravenous contrast. Multiplanar reformatted images are provided for review. Automated exposure control, iterative reconstruction, and/or weight based adjustment of the mA/kV was utilized to reduce the radiation dose to as low as reasonably achievable. COMPARISON: Head CT reported separately today. Cervical spine CT 09/24/2024. CLINICAL HISTORY: Patient is a 89 year old female. Status post fall this morning striking the back of the head. FINDINGS: BONES AND ALIGNMENT: Stable cervical lordosis. Multilevel degenerative spondylolisthesis from C5-C6 to C7-T1. Degenerative ankylosis of the C2-C3 posterior elements redemonstrated. Chronic very severe C1-C2 degeneration asymmetric to the left. Stable visible upper thoracic levels including chronic appearing compression fractures of T1, T3, T4. No acute fracture or traumatic malalignment. DEGENERATIVE CHANGES: Chronic severe disc and endplate degeneration at C5-C6 and C6-C7 including vacuum disc. Chronic lower cervical spinal stenosis suspected and appears stable by CT. SOFT TISSUES: Bulky left carotid calcified  atherosclerosis. Chronic confluent and partially calcified apical lung scarring. Partially visible upper lung emphysema appears moderate to severe. No prevertebral soft tissue swelling. IMPRESSION: 1. No acute traumatic injury identified in the cervical spine. 2. Stable advanced chronic cervical spine degeneration superimposed on C2-C3 degenerative ankylosis. 3. Chronic upper thoracic compression fractures, lung scarring, emphysema. Electronically signed by: Helayne Hurst MD 12/01/2024 12:04 PM EST RP Workstation: [id number]   CT Head Wo Contrast Result Date: 12/01/2024 EXAM: CT HEAD WITHOUT 12/01/2024 11:50:50 AM TECHNIQUE: CT of the head was performed without the administration of intravenous contrast. Automated exposure control, iterative reconstruction, and/or weight based adjustment of the mA/kV was utilized to reduce the radiation dose to as low as reasonably achievable. COMPARISON: Head CT 09/24/2024. CLINICAL HISTORY: 89 year old female status post fall this morning striking the back of the head, with pain. FINDINGS: BRAIN AND VENTRICLES: No acute intracranial hemorrhage. No mass effect or midline shift. No extra-axial fluid collection. No evidence of acute infarct. No hydrocephalus. Stable brain volume.  Patchy and confluent chronic bilateral cerebral white matter hypodensity appears stable. Small area of chronic encephalomalacia in the medial left parietal lobe is stable. Stable gray white differentiation. Calcified atherosclerosis at the skull base. No suspicious intracranial vascular hyperdensity. ORBITS: No acute abnormality. SINUSES AND MASTOIDS: Chronic right maxillary sinusitis with mucoperiosteal thickening. Other paranasal sinuses, tympanic cavities, and mastoids remain well aerated. SOFT TISSUES AND SKULL: Broad based posterior scalp soft tissue swelling just above the occipital protuberance. Trace skin defect there. Underlying skull appears stable and intact. No acute skull fracture. IMPRESSION:  1. Posterior scalp soft tissue injury. 2. No acute intracranial abnormality. Electronically signed by: Helayne Hurst MD 12/01/2024 12:00 PM EST RP Workstation: [id number]     EKG: My personal interpretation of EKG shows: Normal sinus rhythm heart rate 87, left bundle branch and right bundle branch block.    Assessment/Plan: Principal Problem:   Closed fracture of manubrium Active Problems:   History of colon cancer   Essential hypertension   Hyperlipidemia   Chronic vertigo   Fall at home, initial encounter    Assessment and Plan: Mechanical fall at home Scalp laceration status post 5 staples  Nondisplaced mandibular fracture with small anteriorsternal hematoma -Presented emergency department after mechanical fall.  Further workup revealed acute nondisplaced mandibular fracture with a small anteromedial sternal hematoma.  Chronic L2 compression fracture.  Chronic thoracic vertebral and posterior rib fracture since 2024. -CT head no acute intracranial bleeding. At presentation to ED patient initially found hypertensive due to pain response eventually blood pressure has been improved - EDP discussed case with trauma surgeon Dr. Rubin with general surgery regarding manubrial fracture and mediastinal hematoma.  General surgery felt that if the patient's pain is well-controlled and she is able to have good effort on incentive spirometry she could be discharged.  However, patient noted to have poor effort on incentive spirometry, failed ambulation, and admission requested for pain control and further management.  -In the ED patient received IV Tylenol  1000 mg - Continue oral Tylenol  for mild pain, Roxicodone  for moderate pain and IV morphine  for severe pain control. - Continue conservative management supportive care. - Consulted inpatient PT and OT.   Essential hypertension -Blood pressure has been improved.  Continue Lopressor  12.5 mg twice daily  Chronic vertigo -Continue meclizine   as needed    DVT prophylaxis:  SCDs Code Status:  Full Code Diet: Heart healthy diet Family Communication:   Family was present at bedside, at the time of interview. Opportunity was given to ask question and all questions were answered satisfactorily.  Disposition Plan: Continue monitor improvement of pain Consults: PT and OT Admission status:   Observation, Telemetry bed  Severity of Illness: The appropriate patient status for this patient is OBSERVATION. Observation status is judged to be reasonable and necessary in order to provide the required intensity of service to ensure the patient's safety. The patient's presenting symptoms, physical exam findings, and initial radiographic and laboratory data in the context of their medical condition is felt to place them at decreased risk for further clinical deterioration. Furthermore, it is anticipated that the patient will be medically stable for discharge from the hospital within 2 midnights of admission.     Katie Polson, MD [facility]  How to contact the [facility] Attending or Consulting provider 7A - 7P or covering provider during after hours 7P -7A, for this patient.  Check the care team in [facility] and look for a) attending/consulting [facility] provider listed and b) the [facility] team listed Log  into [email or web address] and use 's universal password to access. If you do not have the password, please contact the hospital operator. Locate the [facility] provider you are looking for under [facility] and page to a number that you can be directly reached. If you still have difficulty reaching the provider, please page the [facility] (Director on Call) for the Hospitalists listed on amion for assistance.  12/02/2024, 6:32 AM           [1]  Allergies Allergen Reactions   Erythromycin Hives   Other Other (See Comments)    HAS A LOT OF SKIN ALLERGIES   Pine Other (See Comments)    HAS A SKIN REACTION TO THIS- tested allergic

## 2024-12-02 NOTE — ED Notes (Signed)
 Pt son-n-law notified that she now has a bed and will be transported as soon as possible.

## 2024-12-02 NOTE — TOC Initial Note (Signed)
 Transition of Care [facility]) - Initial/Assessment Note    Patient Details  Name: Katie Leonard MRN: [id number] Date of Birth: 1925/07/27  Transition of Care [facility]) CM/SW Contact:    Britt JULIANNA Bennetts, LCSW Phone Number: 12/02/2024, 2:54 PM  Clinical Narrative:                 CSW received consult for possible SNF placement at time of discharge. CSW spoke with patient's daughter, Jenkins, as patient is currently only oriented to person. Patient's daughter reported that patient is from an ALF ([facility]).  The family is undecided on whether the patient will return to ALF with a sitter or discharge to a SNF.  CSW explained both options and daughter would like to know the cost of a sitter and is a agreeable to CSW faxing out SNF referral.  CSW discussed insurance authorization process and will provide Medicare SNF ratings list.  RNCM informed of daughter's request for information about personal care services for pt.  CSW will send out referrals for review and provide bed offers as available.    Skilled Nursing Rehab Facilities-   [email or web address]   Ratings out of 5 stars (5 the highest)  Name Address  Phone # Quality Care Staffing Health Inspection Overall  [facility] [address] [phone] 2 1 2 1   [facility] [address], [place] [phone] 5 2 4 5   [facility] [address], [facility] [phone] [facility] [address] [place] [phone] 4 3 4 4   [facility]  [address], [place] [phone] 4 3 5 5   [facility] [address] [facility] [phone] 5 3 2 3   [facility] [address] [place] [phone] 5 1 2 2   [facility] [address], [place] [phone] 3 4 4 4   [address] ([place]) [address] [place] [phone] 1 3 3 2   [facility] [address] [place], [place] [phone] 4 2 2 2   [facility]  ([place]) [address], [place] [phone] 2 1 1 1   Clotilda Pereyra [address] Arlana [place] [phone] 2 4 4 4   [facility] [address], [place] [phone] 3 1 3 2   [facility] ([facility]) [address], [place] [phone] 1 2 4 3           [facility] [address], [place] [phone] 3 1 5 4   [facility] [address], [place] [phone] 4 2 1 1   [facility]  [address] [place] [phone] 2 4 1 1   [facility] [address] [phone] 2 2 5 5   [facility] [address] [place] [address], [place] [phone] 2 2 3 3           [facility] [address], [place] [phone] 2 1 2 1   [facility] (No [facility]) [address], [employer] [place] [phone] 4 3 4 4   [facility] [address], [facility] [phone] 3  5 5   [facility] [address] [place] [phone] 4 2 1 1   Marionette Milian [address] [place] [phone] 3 1 2 1   [facility] [address] [place] [phone] 1 3 3 2   [facility] [address] [place] [phone] 2 2 3 3   [facility] [address] [place], [place] [phone] 2 1 4 3   [facility] [address], [employer]  [phone] 2 1 1 1   [facility] [address] [place] [phone] 2 1 2 1   [facility] [address] [place] [phone] 3 1 2 1           [facility] [address] [place] [phone] 4 1 3 2   [facility] [address], [place]  [phone] 2 4 4 4   [facility] (No [employer]) [address], [place] [phone] 3 2 5 5   [facility]) [address], [place] [phone] 3 2 3 3   [facility] [address], [place] [phone] 5 3 5 5   [facility] [address], [place] [phone] 2 1 1 1   [facility] [address] [place],  [place] [phone] 3 5 5 5           [facility] [address] [place], [place] [phone] 5 4 5 5   [facility])  [address] [place] [phone] 1 1 2 1   [facility]) [address], [place] [phone]  2 4 4   [facility] [address], [place] [phone] 3 5 5 5   [address] [place], [place] [phone] 4 2 2 2   [facility]) [address] [place] [phone] 1 1 3 1   [facility] [address], [place] [phone] 2 2 2 2     Expected Discharge Plan: Skilled Nursing Facility Barriers to Discharge: Continued Medical Work up, English As A Second Language Teacher, SNF Pending bed offer   Patient Goals and CMS Choice            Expected Discharge Plan and Services In-house Referral: Clinical Social Work     Living arrangements for the past 2 months: Assisted Living Facility                                      Prior Living Arrangements/Services Living arrangements for the past 2 months: Assisted Living Facility Lives with:: Facility Resident Patient language and need for interpreter reviewed:: Yes        Need for Family Participation in Patient Care: Yes (Comment) Care giver support system in place?: Yes (comment)   Criminal Activity/Legal Involvement Pertinent to Current Situation/Hospitalization: No - Comment as needed  Activities of Daily Living      Permission Sought/Granted                  Emotional Assessment       Orientation: : Oriented to Self Alcohol / Substance Use: Not Applicable Psych Involvement: No (comment)  Admission diagnosis:  Closed fracture of manubrium [S22.21XA] Mediastinal hematoma, initial encounter [S27.892A] Closed fracture of manubrium, initial encounter [S22.21XA] Fall, initial encounter [W19.XXXA] Scalp laceration, initial encounter [S01.01XA] Patient Active Problem List   Diagnosis Date Noted   History of  colon cancer 12/02/2024   Essential hypertension 12/02/2024   Hyperlipidemia 12/02/2024   Chronic vertigo 12/02/2024   Fall at home, initial encounter 12/02/2024   Closed fracture of manubrium 12/01/2024   PCP:  Sophronia Ozell BROCKS, MD Pharmacy:   [facility] [place], [place] - [address] [place] [address] Phone: [phone] Fax: [phone]     Social Drivers of Health (SDOH) Social History: SDOH Screenings   Food Insecurity: No Food Insecurity (12/02/2024)  Housing: Low Risk (12/02/2024)  Transportation Needs: No Transportation Needs (12/02/2024)  Utilities: Not At Risk (12/02/2024)  Financial Resource Strain: Low Risk (11/07/2023)   [facility] from [facility]  Physical Activity: Insufficiently Active (11/07/2023)   [facility]  Social Connections: Moderately Integrated (11/07/2023)   [facility]  Stress: No Stress Concern Present (11/07/2023)   [facility]  Tobacco Use: Medium Risk (12/02/2024)   SDOH Interventions: Food Insecurity Interventions: Intervention Not Indicated Housing Interventions: Intervention Not Indicated Transportation Interventions: Intervention Not Indicated Utilities Interventions: Intervention Not Indicated   Readmission Risk Interventions     No data to display

## 2024-12-02 NOTE — Progress Notes (Signed)
 PROGRESS NOTE    Shontavia Mickel  FMW:[id number] DOB: 23-Dec-1924 DOA: 12/01/2024 PCP: Sophronia Ozell BROCKS, MD   Brief Narrative:  DAY ZERO NOTE -please see H&P earlier this morning by my partner for complete history  Briefly patient is a 89 year old female who presents from [facility] after a fall with staff present with worsening neck and back pain with also notable head strike during fall.  Posterior scalp laceration repaired in the ED C-spine and CT head negative for any acute findings.  CT thoracic spine shows progression of nonacute appearing thoracic vertebral body and posterior rib fracture from 2024. General surgery consulted at intake regarding manubrial fracture with mediastinal hematoma, given currently well-controlled pain there is no indication for intervention at this time.  Patient admitted to our facility for apparently worsening respiratory status and ambulatory dysfunction as well as reported uncontrolled pain.  Assessment & Plan:   Principal Problem:   Closed fracture of manubrium Active Problems:   History of colon cancer   Essential hypertension   Hyperlipidemia   Chronic vertigo   Fall at home, initial encounter   Mechanical fall at facility Scalp laceration status post 5 staples, POA Nondisplaced mandibular fracture with small anteriorsternal hematoma, POA - Multiple images noted as below with nondisplaced mandibular fracture with small sternal hematoma, as well as chronic findings including L2 and thoracic vertebral and posterior rib fractures from last year.  CT head negative for acute findings. - Patient reported to have uncontrolled pain at intake - PT following, recommending discharge to SNF per discussion with family - Patient has required no analgesics since intake.  Discontinue IV narcotics, decrease oxycodone  dosing given patient's age and weight -has not required analgesics since single IV Tylenol  dose in the ED on 12/01/2024   Respiratory distress,  POA - Patient reported to have poor inspiratory effort in the setting of trauma although notably patient's rib fractures are not acute - Patient has no signs or symptoms of hypoxia or respiratory distress at this time  Ambulatory dysfunction, acute on chronic Chronic vertigo -On meclizine , continue sparingly - PT OT following recommending SNF per family discussion  Acute encephalopathy, rule out delirium versus sundowning  Rule out toxic component -Unlikely toxic given patient did not utilize narcotics per chart review - Given patient's age however she is extremely high risk for delirium -continue supportive care  Essential hypertension -Minimally elevated today, increase metoprolol  with caution given patient's age and risk for orthostatic symptoms especially in light of recent fall and chronic vertigo   DVT prophylaxis: SCDs Start: 12/02/24 0338 Place TED hose Start: 12/02/24 0338   Code Status:   Code Status: Full Code  Family Communication: Updated daughter over the phone  Status is: Observation  Dispo: The patient is from: [facility]              Anticipated d/c is to: To be determined              Anticipated d/c date is: 24-hour              Patient currently is medically stable for discharge  Consultants:  None  Procedures:  None  Antimicrobials:  None indicated  Subjective: No acute issues or events overnight, patient continues to be somewhat confused this morning unable to be redirected by staff and continues to attempt to get out of bed.  She is pleasant however denies any nausea vomiting diarrhea constipation headache fevers chills chest pain shortness of breath or other symptoms at  this time and feels well just wants to go home  Objective: Vitals:   12/02/24 0400 12/02/24 0538 12/02/24 0550 12/02/24 0806  BP: (!) 179/97 (!) 180/89 (!) 164/82 120/68  Pulse: 76 72 74 82  Resp: 20 20 20 18   Temp: 98.7 F (37.1 C)   98.3 F (36.8 C)  TempSrc: Oral    Oral  SpO2: 94% 95% 95%   Weight: 39.3 kg     Height: 5' 3 (1.6 m)       Intake/Output Summary (Last 24 hours) at 12/02/2024 9082 Last data filed at 12/02/2024 0500 Gross per 24 hour  Intake 60 ml  Output --  Net 60 ml   Filed Weights   12/02/24 0400  Weight: 39.3 kg    Examination:  General: Thin appearing elderly woman attempting to climb over guardrail on bed, pleasantly confused and easily redirectable HEENT: Noted posterior laceration and sutures clean dry intact. Neck:  Without mass or deformity.  Trachea is midline. Lungs:  Clear to auscultate bilaterally without rhonchi, wheeze, or rales. Heart:  Regular rate and rhythm.  Without murmurs, rubs, or gallops. Abdomen:  Soft, nontender, nondistended.  Without guarding or rebound. Extremities: Without cyanosis, clubbing, edema, or obvious deformity. Skin:  Warm and dry, no erythema.  Data Reviewed: I have personally reviewed following labs and imaging studies  CBC: Recent Labs  Lab 12/01/24 1805  WBC 10.7*  NEUTROABS 8.2*  HGB 13.6  HCT 41.4  MCV 101.0*  PLT 232   Basic Metabolic Panel: Recent Labs  Lab 12/01/24 1805  NA 140  K 4.1  CL 104  CO2 28  GLUCOSE 107*  BUN 16  CREATININE 0.56  CALCIUM  9.3   GFR: Estimated Creatinine Clearance: 24.9 mL/min (by C-G formula based on SCr of 0.56 mg/dL).   No results found for this or any previous visit (from the past 240 hours).       Radiology Studies: CT CHEST ABDOMEN PELVIS W CONTRAST Result Date: 12/01/2024 EXAM: CT CHEST, ABDOMEN AND PELVIS WITH CONTRAST 12/01/2024 07:24:47 PM TECHNIQUE: CT of the chest, abdomen and pelvis was performed with the administration of 65 mL of iohexol  (OMNIPAQUE ) 300 MG/ML solution. Multiplanar reformatted images are provided for review. Automated exposure control, iterative reconstruction, and/or weight based adjustment of the mA/kV was utilized to reduce the radiation dose to as low as reasonably achievable. COMPARISON:  None available. CLINICAL HISTORY: Polytrauma, blunt. FINDINGS: CHEST: MEDIASTINUM AND LYMPH NODES: The heart is mildly enlarged. There are atherosclerotic calcifications of the aorta and coronary arteries. There are low attenuation nodules in the thyroid gland measuring up to 7 mm. The central airways are clear. No mediastinal, hilar or axillary lymphadenopathy. LUNGS AND PLEURA: There are biapical calcified pleural plaques. There are trace bilateral pleural effusions. There is pleuroparenchymal scarring and blunt lung apices. There is severe emphysema. There is a focal ground glass opacity in the left upper lobe measuring 3.5 x 4.0 cm. No pneumothorax. ABDOMEN AND PELVIS: LIVER: The liver is unremarkable. GALLBLADDER AND BILE DUCTS: Gallbladder is unremarkable. No biliary ductal dilatation. SPLEEN: No acute abnormality. PANCREAS: No acute abnormality. ADRENAL GLANDS: No acute abnormality. KIDNEYS, URETERS AND BLADDER: Small left peripelvic cysts are present in the left kidney. No stones in the kidneys or ureters. No hydronephrosis. No perinephric or periureteral stranding. Urinary bladder is unremarkable. GI AND BOWEL: Stomach demonstrates no acute abnormality. Rectosigmoid anastomosis is present. There is a large amount of stool throughout the entire colon. The appendix is not visualized. There is  no bowel obstruction. REPRODUCTIVE ORGANS: No acute abnormality. PERITONEUM AND RETROPERITONEUM: No ascites. No free air. VASCULATURE: Aorta is normal in caliber. There are atherosclerotic calcifications of the abdominal aorta. ABDOMINAL AND PELVIS LYMPH NODES: No lymphadenopathy. BONES AND SOFT TISSUES: There is acute appearing nondisplaced manubrial fracture with some anteromediastinal hematoma. There is chronic appearing mid sternal fracture with callus formation. The bones are diffusely osteopenic. There are degenerative changes of both shoulders. There are healed bilateral rib fractures. Posterolateral left 7th and  8th rib fractures are subacute. Impression deformities of T4, T6, T8, T9 and T10 are present. Please see dedicated CT of the thoracic spine for further description. L2 compression fracture is present, favored as chronic but new from the prior examination. Posterior fusion hardware is seen at L4-L5. No focal soft tissue abnormality. IMPRESSION: 1. Acute appearing nondisplaced manubrial fracture with small anteromediastinal hematoma. 2. Severe emphysema. Pulmonary emphysema is an independent risk factor for lung cancer; recommend consideration for evaluation for a low-dose CT lung cancer screening program. 3. Left upper lobe ground-glass opacity measuring 3.5 x 4.0 cm; recommend short-interval chest CT in 3-6 months to assess for persistence or evolution, with consideration of further evaluation if a solid component develops. 4. L2 compression fracture, favored as chronic but new from the prior examination. Electronically signed by: Greig Pique MD 12/01/2024 07:51 PM EST RP Workstation: [id number]   DG Chest Portable 1 View Result Date: 12/01/2024 EXAM: 1 VIEW(S) XRAY OF THE CHEST 12/01/2024 06:19:00 PM COMPARISON: 11/22/2023 CLINICAL HISTORY: uncontrolled HTN FINDINGS: LUNGS AND PLEURA: Hyperinflation. Chronic interstitial changes. No focal pulmonary opacity. No pleural effusion. No pneumothorax. HEART AND MEDIASTINUM: Calcified and tortuous aorta, including along the left lower hemithorax. No acute abnormality of the cardiac and mediastinal silhouettes. BONES AND SOFT TISSUES: Compression deformities in the lower thoracic spine. Multiple bilateral rib fractures, chronic on the left but favored to be acute/subacute on the right. IMPRESSION: 1. Multiple right-sided rib fractures, favored to be acute/subacute. 2. No acute cardiopulmonary abnormality. Electronically signed by: Pinkie Pebbles MD 12/01/2024 06:28 PM EST RP Workstation: [id number]   CT Thoracic Spine Wo Contrast Result Date: 12/01/2024 EXAM: CT  THORACIC SPINE WITHOUT CONTRAST 12/01/2024 11:50:50 AM TECHNIQUE: CT of the thoracic spine was performed without the administration of intravenous contrast. Multiplanar reformatted images are provided for review. Automated exposure control, iterative reconstruction, and/or weight based adjustment of the mA/kV was utilized to reduce the radiation dose to as low as reasonably achievable. COMPARISON: Thoracic spine CT 11/22/2023, Cervical spine CT 09/24/2024, Cervical spine CT 12/01/2024 (reported separately). CLINICAL HISTORY: 89 year old female with mid-back pain, status post fall this morning. FINDINGS: Normal thoracic spine segmentation. BONES AND ALIGNMENT: Chronically exaggerated thoracic kyphosis in the setting of multilevel chronic thoracic spinal compression fractures. Stable mild compression fractures at T1, T3, and T9. Stable moderate T6 compression fracture. Stable severe T10 compression fracture (vertebra plana). A mild to moderate T4 compression fracture is new from the CT 11/22/2023 but stable from cervical spine CT 09/24/2024. Moderate to severe T8 compression fracture is new since 11/22/2023 but sclerotic and appears nonacute. T2, T5, T7, T11, T12, and the visible upper lumbar vertebral bodies remain intact. Chronic posterior rib fractures bilaterally, and some of these are new from 11/22/2023 including healing left 7th and 8th posterolateral rib fractures. Healing left posterior 10th rib fracture also noted. No obvious acute posterior rib fracture. No acute osseous abnormality identified in the thoracic spine. DEGENERATIVE CHANGES: Multilevel chronic thoracic spinal compression fractures are present, as detailed in the BONES AND  ALIGNMENT section. SOFT TISSUES: Thoracic paraspinal soft tissues remain within normal limits. Advanced chronic aortic calcified atherosclerosis. Negative visible non-contrast upper abdominal viscera. No pericardial or pleural effusion. Chronic severe emphysema. Stable  confluent and partially calcified apical lung scarring. Stable chronic architectural distortion lateral to the left hilum on series 3 image 53. IMPRESSION: 1. Progression of non-acute appearing thoracic vertebral body and posterior rib fractures since 2024 Thoracic CT. But No acute osseous abnormality identified in the thoracic spine. 2. Advanced emphysema and calcified aortic atherosclerosis. Electronically signed by: Helayne Hurst MD 12/01/2024 12:12 PM EST RP Workstation: [id number]   CT Cervical Spine Wo Contrast Result Date: 12/01/2024 EXAM: CT CERVICAL SPINE WITHOUT CONTRAST 12/01/2024 11:50:50 AM TECHNIQUE: CT of the cervical spine was performed without the administration of intravenous contrast. Multiplanar reformatted images are provided for review. Automated exposure control, iterative reconstruction, and/or weight based adjustment of the mA/kV was utilized to reduce the radiation dose to as low as reasonably achievable. COMPARISON: Head CT reported separately today. Cervical spine CT 09/24/2024. CLINICAL HISTORY: Patient is a 89 year old female. Status post fall this morning striking the back of the head. FINDINGS: BONES AND ALIGNMENT: Stable cervical lordosis. Multilevel degenerative spondylolisthesis from C5-C6 to C7-T1. Degenerative ankylosis of the C2-C3 posterior elements redemonstrated. Chronic very severe C1-C2 degeneration asymmetric to the left. Stable visible upper thoracic levels including chronic appearing compression fractures of T1, T3, T4. No acute fracture or traumatic malalignment. DEGENERATIVE CHANGES: Chronic severe disc and endplate degeneration at C5-C6 and C6-C7 including vacuum disc. Chronic lower cervical spinal stenosis suspected and appears stable by CT. SOFT TISSUES: Bulky left carotid calcified atherosclerosis. Chronic confluent and partially calcified apical lung scarring. Partially visible upper lung emphysema appears moderate to severe. No prevertebral soft tissue  swelling. IMPRESSION: 1. No acute traumatic injury identified in the cervical spine. 2. Stable advanced chronic cervical spine degeneration superimposed on C2-C3 degenerative ankylosis. 3. Chronic upper thoracic compression fractures, lung scarring, emphysema. Electronically signed by: Helayne Hurst MD 12/01/2024 12:04 PM EST RP Workstation: [id number]   CT Head Wo Contrast Result Date: 12/01/2024 EXAM: CT HEAD WITHOUT 12/01/2024 11:50:50 AM TECHNIQUE: CT of the head was performed without the administration of intravenous contrast. Automated exposure control, iterative reconstruction, and/or weight based adjustment of the mA/kV was utilized to reduce the radiation dose to as low as reasonably achievable. COMPARISON: Head CT 09/24/2024. CLINICAL HISTORY: 89 year old female status post fall this morning striking the back of the head, with pain. FINDINGS: BRAIN AND VENTRICLES: No acute intracranial hemorrhage. No mass effect or midline shift. No extra-axial fluid collection. No evidence of acute infarct. No hydrocephalus. Stable brain volume. Patchy and confluent chronic bilateral cerebral white matter hypodensity appears stable. Small area of chronic encephalomalacia in the medial left parietal lobe is stable. Stable gray white differentiation. Calcified atherosclerosis at the skull base. No suspicious intracranial vascular hyperdensity. ORBITS: No acute abnormality. SINUSES AND MASTOIDS: Chronic right maxillary sinusitis with mucoperiosteal thickening. Other paranasal sinuses, tympanic cavities, and mastoids remain well aerated. SOFT TISSUES AND SKULL: Broad based posterior scalp soft tissue swelling just above the occipital protuberance. Trace skin defect there. Underlying skull appears stable and intact. No acute skull fracture. IMPRESSION: 1. Posterior scalp soft tissue injury. 2. No acute intracranial abnormality. Electronically signed by: Helayne Hurst MD 12/01/2024 12:00 PM EST RP Workstation: [id number]         Scheduled Meds:  calcium -vitamin D  1 tablet Oral Q breakfast   docusate sodium   100 mg Oral BID   metoprolol  tartrate  12.5 mg Oral BID   sodium chloride  flush  3 mL Intravenous Q12H   sodium chloride  flush  3 mL Intravenous Q12H   Continuous Infusions:  sodium chloride        LOS: 0 days   Time spent:  Elsie JAYSON Montclair, DO [facility]  If 7PM-7AM, please contact night-coverage [email or web address]  12/02/2024, 9:17 AM

## 2024-12-02 NOTE — NC FL2 (Signed)
 [place]  MEDICAID FL2 LEVEL OF CARE FORM     IDENTIFICATION  Patient Name: Katie Leonard Birthdate: 12-May-1925 Sex: female Admission Date (Current Location): 12/01/2024  [facility] and [place] Number:  Producer, Television/film/video and Address:  [facility] [place], [address] [place], [place] [address]      Provider Number: [id number]  Attending Physician Name and Address:  Lue Elsie BROCKS, MD  Relative Name and Phone Number:  Jenkins Schooling  Daughter,  [phone]    Current Level of Care: Hospital Recommended Level of Care: Skilled Nursing Facility Prior Approval Number:    Date Approved/Denied:   PASRR Number: [id number]  Discharge Plan: SNF    Current Diagnoses: Patient Active Problem List   Diagnosis Date Noted   History of colon cancer 12/02/2024   Essential hypertension 12/02/2024   Hyperlipidemia 12/02/2024   Chronic vertigo 12/02/2024   Fall at home, initial encounter 12/02/2024   Closed fracture of manubrium 12/01/2024    Orientation RESPIRATION BLADDER Height & Weight     Self  Normal Continent Weight: 86 lb 10.3 oz (39.3 kg) Height:  5' 3 (160 cm)  BEHAVIORAL SYMPTOMS/MOOD NEUROLOGICAL BOWEL NUTRITION STATUS      Continent Diet (see d/c summary)  AMBULATORY STATUS COMMUNICATION OF NEEDS Skin   Limited Assist Verbally Normal                       Personal Care Assistance Level of Assistance  Bathing, Feeding, Dressing Bathing Assistance: Limited assistance Feeding assistance: Limited assistance Dressing Assistance: Limited assistance     Functional Limitations Info  Sight, Hearing, Speech Sight Info: Impaired Hearing Info: Impaired Speech Info: Adequate    SPECIAL CARE FACTORS FREQUENCY  PT (By licensed PT), OT (By licensed OT)     PT Frequency: 5x/ week OT Frequency: 5x/ week            Contractures Contractures Info: Not present    Additional Factors Info  Code Status, Allergies Code Status Info:  FULL Allergies Info: Erythromycin,  Other,  Pine           Current Medications (12/02/2024):  This is the current hospital active medication list Current Facility-Administered Medications  Medication Dose Route Frequency Provider Last Rate Last Admin   0.9 %  sodium chloride  infusion  250 mL Intravenous PRN Sundil, Subrina, MD       acetaminophen  (TYLENOL ) tablet 650 mg  650 mg Oral Q6H PRN Sundil, Subrina, MD       Or   acetaminophen  (TYLENOL ) suppository 650 mg  650 mg Rectal Q6H PRN Sundil, Subrina, MD       calcium -vitamin D (OSCAL WITH D) 500-5 MG-MCG per tablet 1 tablet  1 tablet Oral Q breakfast Sundil, Subrina, MD   1 tablet at 12/02/24 9071   docusate sodium  (COLACE) capsule 100 mg  100 mg Oral BID Sundil, Subrina, MD   100 mg at 12/02/24 9072   haloperidol  lactate (HALDOL ) injection 1 mg  1 mg Intravenous Q6H PRN Sundil, Subrina, MD       hydrALAZINE  (APRESOLINE ) injection 10 mg  10 mg Intravenous Q6H PRN Sundil, Subrina, MD   10 mg at 12/02/24 1156   meclizine  (ANTIVERT ) tablet 12.5 mg  12.5 mg Oral TID PRN Sundil, Subrina, MD       metoprolol  tartrate (LOPRESSOR ) tablet 12.5 mg  12.5 mg Oral BID Sundil, Subrina, MD   12.5 mg at 12/02/24 0927   morphine  (PF) 2 MG/ML  injection 1 mg  1 mg Intravenous Q4H PRN Sundil, Subrina, MD       ondansetron  (ZOFRAN ) tablet 4 mg  4 mg Oral Q6H PRN Sundil, Subrina, MD       Or   ondansetron  (ZOFRAN ) injection 4 mg  4 mg Intravenous Q6H PRN Sundil, Subrina, MD       oxyCODONE  (Oxy IR/ROXICODONE ) immediate release tablet 5 mg  5 mg Oral Q6H PRN Sundil, Subrina, MD       sodium chloride  flush (NS) 0.9 % injection 3 mL  3 mL Intravenous Q12H Sundil, Subrina, MD       sodium chloride  flush (NS) 0.9 % injection 3 mL  3 mL Intravenous Q12H Sundil, Subrina, MD   3 mL at 12/02/24 9070   sodium chloride  flush (NS) 0.9 % injection 3 mL  3 mL Intravenous PRN Sundil, Subrina, MD         Discharge Medications: Please see discharge summary for a list of  discharge medications.  Relevant Imaging Results:  Relevant Lab Results:   Additional Information SSN: [id number]  Britt FALCON Krystle Oberman, LCSW

## 2024-12-02 NOTE — TOC Progression Note (Signed)
 Transition of Care (TOC) - Progression Note  Rayfield Gobble RN, BSN Inpatient Care Management Unit 4E- RN Case Manager See Treatment Team for direct phone #   Patient Details  Name: Katie Leonard MRN: [id number] Date of Birth: 1925-08-06  Transition of Care [facility] CM/SW Contact  Gobble Rayfield Hurst, RN Phone Number: 12/02/2024, 4:23 PM  Clinical Narrative:    Return call received from daughter regarding possible sitter needs. Reviewed with daughter potential cost for 24/7 sitters- $20-25 average or more depending on what she needed the sitters to do for her mom.  Daughter to google for local sitter options and also request any brochures that CM has available to be dropped off at pt's bedside.  CM will f/u for this in the am.   Discussed option of possible return to ALF w/ 24/7 sitter vs [facility] and process for SNF placement including bed offers and need for insurance approval.   At this time daughter voiced she is leaning towards [facility] pending bed offers- will have CSW follow up in the am.    Expected Discharge Plan: Skilled Nursing Facility Barriers to Discharge: Continued Medical Work up, English As A Second Language Teacher, SNF Pending bed offer               Expected Discharge Plan and Services In-house Referral: Clinical Social Work     Living arrangements for the past 2 months: Assisted Living Facility                                       Social Drivers of Health (SDOH) Interventions SDOH Screenings   Food Insecurity: No Food Insecurity (12/02/2024)  Housing: Low Risk (12/02/2024)  Transportation Needs: No Transportation Needs (12/02/2024)  Utilities: Not At Risk (12/02/2024)  Financial Resource Strain: Low Risk (11/07/2023)   Received from [facility]  Physical Activity: Insufficiently Active (11/07/2023)   Received from [facility]  Social Connections: Moderately Integrated (11/07/2023)   Received from [facility]  Stress: No Stress Concern Present  (11/07/2023)   Received from [facility]  Tobacco Use: Medium Risk (12/02/2024)    Readmission Risk Interventions     No data to display

## 2024-12-02 NOTE — Evaluation (Signed)
 Physical Therapy Evaluation Patient Details Name: Katie Leonard MRN: [id number] DOB: [birth year] Today's Date: 12/02/2024  History of Present Illness  89 y/o F presenting to ED on 12/13 from [facility] after mechanical fall, with head and upper back/neck pain. Posterior head lac repaired with 5 staples in ED. CT head and C spien negative. CT T spine shows progression of nonacute thoracic vertebral body and posterior rib fxs since 2024. Ct chest with nondisplaced manubrial fx with anteromediastinal hematoma    PMH includes colon CA, glaucoma, insomnia, vertigo, HLD   Clinical Impression  Pt presents with condition above and deficits mentioned below, see PT Problem List. Pt resides at [facility]. Called her facility and they reported pt is typically mod I for functional mobility utilizing her rollator and is cognitively intact at baseline. They did confirm pt can be impulsive at baseline though. Currently, the pt appears confused with poor memory, awareness, and attention. She is impulsive and needs frequent redirecting to remain on task or wait for therapist before moving. She also appears to have some L inattention as she tends to veer to her L and run into obstacles on her L often, even admitting she did not see the person in the chair to her L when she almost ran into her. She also displays some mild L leg weakness compared to her R, most noticeably with ankle dorsiflexion. The med tech at her facility denies pt running into obstacles at baseline or any noticeable L leg weakness at baseline. Notified RN and MD of these concerns. Currently, she is only needing CGA with intermittent minA for safety with functional mobility, but she is at high risk for further falls. If her ALF can arrange someone to supervise her 24/7 or at least with all standing mobility she could return there with HHPT follow-up. The med tech at her facility reported the family would likely have to hire a sitter to do this. If  not, she may benefit from short-term inpatient rehab, < 3 hours/day. Will continue to follow acutely.       If plan is discharge home, recommend the following: A little help with walking and/or transfers;A little help with bathing/dressing/bathroom;Assistance with cooking/housework;Direct supervision/assist for medications management;Direct supervision/assist for financial management;Assist for transportation;Help with stairs or ramp for entrance;Supervision due to cognitive status   Can travel by private vehicle   Yes    Equipment Recommendations Other (comment) (defer to next venue of care)  Recommendations for Other Services       Functional Status Assessment Patient has had a recent decline in their functional status and demonstrates the ability to make significant improvements in function in a reasonable and predictable amount of time.     Precautions / Restrictions Precautions Precautions: Fall Precaution/Restrictions Comments: impulsive, veers L Restrictions Weight Bearing Restrictions Per Provider Order: No      Mobility  Bed Mobility Overal bed mobility: Needs Assistance Bed Mobility: Supine to Sit, Sit to Supine     Supine to sit: Contact guard Sit to supine: Contact guard assist   General bed mobility comments: CGA for safety due to pt being impulsive with bed mobility    Transfers Overall transfer level: Needs assistance Equipment used: Rolling walker (2 wheels) Transfers: Sit to/from Stand Sit to Stand: Min assist           General transfer comment: MinA for balance standing from EOB to RW    Ambulation/Gait Ambulation/Gait assistance: Contact guard assist, +2 safety/equipment Gait Distance (Feet): 140  Feet Assistive device: Rolling walker (2 wheels) Gait Pattern/deviations: Step-through pattern, Decreased step length - right, Decreased step length - left, Decreased stride length, Trunk flexed, Drifts right/left Gait velocity: reduced Gait  velocity interpretation: <1.8 ft/sec, indicate of risk for recurrent falls   General Gait Details: Pt ambulates with a flexed posture and with poor RW management, often pushing it distally to her. Pt veers to the L as well with poor awareness of people and objects, bumping things often and reporting not seeing them. Pt needs cues to stay within RW and to direct her down and back up the hall and around obstacles. CGA for safety throughout  Stairs            Wheelchair Mobility     Tilt Bed    Modified Rankin (Stroke Patients Only) Modified Rankin (Stroke Patients Only) Pre-Morbid Rankin Score: Moderate disability Modified Rankin: Moderately severe disability     Balance Overall balance assessment: Needs assistance Sitting-balance support: Feet supported Sitting balance-Leahy Scale: Good Sitting balance - Comments: reaches outside BOS without LOB     Standing balance-Leahy Scale: Poor Standing balance comment: reliant on RW support                             Pertinent Vitals/Pain Pain Assessment Pain Assessment: Faces Faces Pain Scale: Hurts little more Pain Location: posterior neck Pain Descriptors / Indicators: Discomfort Pain Intervention(s): Limited activity within patient's tolerance, Monitored during session, Repositioned    Home Living Family/patient expects to be discharged to:: Assisted living                   Additional Comments: [facility]    Prior Function Prior Level of Function : Independent/Modified Independent             Mobility Comments: rollator for mobility mod I, several falls in the pas few weeks per [facility] med tech (via phone call) ADLs Comments: ind with dressing grooming, eating, assist for bathing     Extremity/Trunk Assessment   Upper Extremity Assessment Upper Extremity Assessment: Defer to OT evaluation    Lower Extremity Assessment Lower Extremity Assessment: Generalized weakness;LLE  deficits/detail LLE Deficits / Details: denied numbness/tingling bil; coordination appeared fairly symmetrical bil; however, L leg was weaker than R with MMT scores of 4L 4+R hip flexion, 4+L 5R knee extension, 3L 4+R ankle dorsiflexion    Cervical / Trunk Assessment Cervical / Trunk Assessment: Kyphotic  Communication   Communication Communication: Impaired Factors Affecting Communication: Hearing impaired    Cognition Arousal: Alert Behavior During Therapy: Restless, Impulsive   PT - Cognitive impairments: Orientation, Awareness, Memory, Attention, Safety/Judgement   Orientation impairments: Place                   PT - Cognition Comments: Pt intermittently unaware she is at the hospital already even though she just stated it a moment earlier. She is HOH, which may be preventing her from following cues consistently. She is easily distracted, impulsive, and restless. Poor awareness of her safety and of her surroundings to her L, often bumping into obstacles/people on her L, reporting she did not see them. At baseline, [facility] med tech reports pt is cognitively intact. Following commands: Impaired Following commands impaired: Follows one step commands with increased time     Cueing Cueing Techniques: Verbal cues, Tactile cues     General Comments General comments (skin integrity, edema, etc.): VSS, SpO2  in 90s with ambulation on RA    Exercises     Assessment/Plan    PT Assessment Patient needs continued PT services  PT Problem List Decreased strength;Decreased activity tolerance;Decreased balance;Decreased mobility;Decreased cognition;Decreased knowledge of use of DME;Decreased safety awareness       PT Treatment Interventions DME instruction;Gait training;Functional mobility training;Therapeutic activities;Therapeutic exercise;Balance training;Neuromuscular re-education;Cognitive remediation;Patient/family education    PT Goals (Current goals can be found in  the Care Plan section)  Acute Rehab PT Goals Patient Stated Goal: to go home PT Goal Formulation: With patient Time For Goal Achievement: 12/16/24 Potential to Achieve Goals: Good    Frequency Min 2X/week     Co-evaluation   Reason for Co-Treatment: Necessary to address cognition/behavior during functional activity;For patient/therapist safety;To address functional/ADL transfers (pt impulsive) PT goals addressed during session: Mobility/safety with mobility;Balance;Proper use of DME OT goals addressed during session: ADL's and self-care       AM-PAC PT 6 Clicks Mobility  Outcome Measure Help needed turning from your back to your side while in a flat bed without using bedrails?: A Little Help needed moving from lying on your back to sitting on the side of a flat bed without using bedrails?: A Little Help needed moving to and from a bed to a chair (including a wheelchair)?: A Little Help needed standing up from a chair using your arms (e.g., wheelchair or bedside chair)?: A Little Help needed to walk in hospital room?: A Little Help needed climbing 3-5 steps with a railing? : Total 6 Click Score: 16    End of Session Equipment Utilized During Treatment: Gait belt Activity Tolerance: Patient tolerated treatment well Patient left: in bed;with call bell/phone within reach;with bed alarm set Nurse Communication: Mobility status;Other (comment) (and MD of concerns of pt's AMS, L veering/inattention, and L leg weakness) PT Visit Diagnosis: Unsteadiness on feet (R26.81);Other abnormalities of gait and mobility (R26.89);Muscle weakness (generalized) (M62.81);Difficulty in walking, not elsewhere classified (R26.2);Other symptoms and signs involving the nervous system (R29.898)    Time: 9054-8992 PT Time Calculation (min) (ACUTE ONLY): 22 min   Charges:   PT Evaluation $PT Eval Moderate Complexity: 1 Mod   PT General Charges $$ ACUTE PT VISIT: 1 Visit         Theo Ferretti,  PT, DPT Acute Rehabilitation Services  Office: [phone]   Theo CHRISTELLA Ferretti 12/02/2024, 12:12 PM

## 2024-12-02 NOTE — Care Management Obs Status (Signed)
 MEDICARE OBSERVATION STATUS NOTIFICATION   Patient Details  Name: Katie Leonard MRN: [id number] Date of Birth: 05/09/1925   Medicare Observation Status Notification Given:  Yes    Charlann Rayfield Hurst, RN 12/02/2024, 4:32 PM

## 2024-12-02 NOTE — Evaluation (Signed)
 Occupational Therapy Evaluation Patient Details Name: Katie Leonard MRN: [id number] DOB: [birth year] Today's Date: 12/02/2024   History of Present Illness   89 y/o F presenting to ED on 12/13 from [facility] after mechanical fall, with head and upper back/neck pain. Posterior head lac repaired with 5 staples in ED. CT head and C spine negative. CT T spine shows progression of nonacute thoracic vertebral body and posterior rib fxs since 2024. CT chest with nondisplaced manubrial fx with anteromediastinal hematoma    PMH includes colon CA, glaucoma, insomnia, vertigo, HLD     Clinical Impressions Pt from ALF, uses rollator for mobility and has assist for bathing, IADLs. Pt currently needing up to mod A for ADLs, CGA for bed mobility and min A for transfers with RW, pt veering to L frequently with longer distance ambulation. Pt with SpO2 in 90s throughout on RA. Pt presenting with impairments listed below, will follow acutely. Patient will benefit from continued inpatient follow up therapy, <3 hours/day to maximize safety/ind with ADL/functional mobility.      If plan is discharge home, recommend the following:   A little help with walking and/or transfers;A lot of help with bathing/dressing/bathroom;Assistance with cooking/housework;Direct supervision/assist for medications management;Direct supervision/assist for financial management;Assist for transportation;Help with stairs or ramp for entrance     Functional Status Assessment   Patient has had a recent decline in their functional status and demonstrates the ability to make significant improvements in function in a reasonable and predictable amount of time.     Equipment Recommendations   Other (comment) (defer)     Recommendations for Other Services   PT consult     Precautions/Restrictions   Precautions Precautions: Fall Restrictions Weight Bearing Restrictions Per Provider Order: No     Mobility Bed  Mobility Overal bed mobility: Needs Assistance Bed Mobility: Supine to Sit, Sit to Supine     Supine to sit: Contact guard Sit to supine: Contact guard assist        Transfers Overall transfer level: Needs assistance Equipment used: Rolling walker (2 wheels) Transfers: Sit to/from Stand Sit to Stand: Min assist                  Balance Overall balance assessment: Needs assistance Sitting-balance support: Feet supported Sitting balance-Leahy Scale: Good Sitting balance - Comments: reaches outside BOS without LOB     Standing balance-Leahy Scale: Poor Standing balance comment: reliant on RW support                           ADL either performed or assessed with clinical judgement   ADL Overall ADL's : Needs assistance/impaired Eating/Feeding: Minimal assistance   Grooming: Minimal assistance   Upper Body Bathing: Moderate assistance   Lower Body Bathing: Moderate assistance   Upper Body Dressing : Minimal assistance   Lower Body Dressing: Moderate assistance   Toilet Transfer: Minimal assistance;Rolling walker (2 wheels);Ambulation   Toileting- Clothing Manipulation and Hygiene: Moderate assistance       Functional mobility during ADLs: Minimal assistance;Rolling walker (2 wheels)       Vision   Additional Comments: decr smoothness of tracking multidirectionally -- but does track all quadrants, noted to be drifting L with mobility     Perception         Praxis         Pertinent Vitals/Pain Pain Assessment Pain Assessment: Faces Pain Score: 3  Faces Pain Scale: Hurts little more Pain  Location: posterior neck Pain Descriptors / Indicators: Discomfort Pain Intervention(s): Monitored during session, Limited activity within patient's tolerance, Repositioned     Extremity/Trunk Assessment Upper Extremity Assessment Upper Extremity Assessment: Generalized weakness;Right hand dominant (decr [facility])   Lower Extremity Assessment Lower  Extremity Assessment: Defer to PT evaluation   Cervical / Trunk Assessment Cervical / Trunk Assessment: Kyphotic   Communication Communication Communication: No apparent difficulties   Cognition Arousal: Alert Behavior During Therapy: Restless, Impulsive Cognition: Cognition impaired   Orientation impairments: Time, Situation, Place Awareness: Online awareness impaired Memory impairment (select all impairments): Short-term memory Attention impairment (select first level of impairment): Sustained attention                     Following commands: Impaired Following commands impaired: Follows one step commands with increased time     Cueing  General Comments   Cueing Techniques: Verbal cues  VSS, SpO2 in 90s with ambulation on RA   Exercises     Shoulder Instructions      Home Living Family/patient expects to be discharged to:: Assisted living                                 Additional Comments: [facility]      Prior Functioning/Environment Prior Level of Function : Independent/Modified Independent             Mobility Comments: rollator for mobility ADLs Comments: ind with dressing grooming, eating, assist for bathing    OT Problem List: Decreased strength;Decreased range of motion;Decreased activity tolerance;Impaired balance (sitting and/or standing);Decreased safety awareness;Decreased coordination;Decreased cognition;Decreased knowledge of precautions;Impaired vision/perception   OT Treatment/Interventions: Self-care/ADL training;Therapeutic exercise;Energy conservation;DME and/or AE instruction;Therapeutic activities;Balance training;Patient/family education;Visual/perceptual remediation/compensation;Cognitive remediation/compensation      OT Goals(Current goals can be found in the care plan section)   Acute Rehab OT Goals Patient Stated Goal: none stated OT Goal Formulation: With patient Time For Goal Achievement:  12/16/24 Potential to Achieve Goals: Good ADL Goals Pt Will Perform Upper Body Dressing: with contact guard assist;sitting Pt Will Perform Lower Body Dressing: with contact guard assist;sitting/lateral leans;sit to/from stand Pt Will Transfer to Toilet: with contact guard assist;ambulating;regular height toilet Pt/caregiver will Perform Home Exercise Program: Increased ROM;Increased strength;Both right and left upper extremity;With written HEP provided;With minimal assist Additional ADL Goal #1: pt will follow 2 step command in prep for ADLs   OT Frequency:  Min 2X/week    Co-evaluation   Reason for Co-Treatment: Necessary to address cognition/behavior during functional activity;For patient/therapist safety;To address functional/ADL transfers PT goals addressed during session: Mobility/safety with mobility;Balance;Proper use of DME OT goals addressed during session: ADL's and self-care      AM-PAC OT 6 Clicks Daily Activity     Outcome Measure Help from another person eating meals?: A Little Help from another person taking care of personal grooming?: A Little Help from another person toileting, which includes using toliet, bedpan, or urinal?: A Lot Help from another person bathing (including washing, rinsing, drying)?: A Lot Help from another person to put on and taking off regular upper body clothing?: A Lot Help from another person to put on and taking off regular lower body clothing?: A Lot 6 Click Score: 14   End of Session Equipment Utilized During Treatment: Gait belt;Rolling walker (2 wheels) Nurse Communication: Mobility status  Activity Tolerance: Patient tolerated treatment well Patient left: in bed;with call bell/phone within reach;with bed alarm set  OT Visit Diagnosis: Unsteadiness on feet (R26.81);Other abnormalities of gait and mobility (R26.89);Muscle weakness (generalized) (M62.81)                Time: 0940-1007 OT Time Calculation (min): 27 min Charges:  OT  General Charges $OT Visit: 1 Visit OT Evaluation $OT Eval Moderate Complexity: 1 Mod  Shakora Nordquist K, OTD, OTR/L [employer] [facility] [phone]   Azaylia Fong K Koonce 12/02/2024, 12:01 PM

## 2024-12-03 ENCOUNTER — Other Ambulatory Visit (HOSPITAL_COMMUNITY): Payer: Self-pay

## 2024-12-03 NOTE — TOC CM/SW Note (Signed)
° °  Durable Medical Equipment (From admission, onward)        Start     Ordered  12/03/24 1637  For home use only DME lightweight manual wheelchair with seat cushion  Once      Comments: Patient suffers from ambulatory dysfunction which impairs their ability to perform daily activities like bathing, dressing, feeding, grooming, and toileting in the home.  A cane, crutch, or walker will not resolve  issue with performing activities of daily living. A wheelchair will allow patient to safely perform daily activities. Patient is not able to propel themselves in the home using a standard weight wheelchair due to arm weakness, endurance, and general weakness. Patient can self propel in the lightweight wheelchair. Length of need Lifetime. Accessories: elevating leg rests (ELRs), wheel locks, extensions and anti-tippers.  12/03/24 1637

## 2024-12-03 NOTE — Progress Notes (Signed)
 PROGRESS NOTE    Katie Leonard  FMW:[id number] DOB: [birth year] DOA: 12/01/2024 PCP: Sophronia Ozell BROCKS, MD   Brief Narrative:  Patient is a 89 year old female who presents from [facility] after a fall with staff present with worsening neck and back pain with also notable head strike during fall.  Posterior scalp laceration repaired in the ED C-spine and CT head negative for any acute findings.  CT thoracic spine shows progression of nonacute appearing thoracic vertebral body and posterior rib fracture from 2024. General surgery consulted at intake regarding manubrial fracture with mediastinal hematoma, given currently well-controlled pain there is no indication for intervention at this time.  Patient admitted to our facility for apparently worsening respiratory status and ambulatory dysfunction as well as reported uncontrolled pain.  Patient continues to improve over the past 24 hours no further episodes of respiratory distress uncontrolled pain or other symptoms requiring intervention (other than confusion/sundowning as below).  She is now being evaluated for placement at skilled nursing facility, otherwise she is medically stable for discharge simply awaiting placement at this time.  Assessment & Plan:   Principal Problem:   Closed fracture of manubrium Active Problems:   History of colon cancer   Essential hypertension   Hyperlipidemia   Chronic vertigo   Fall at home, initial encounter   Mechanical fall at facility Scalp laceration status post 5 staples, POA Nondisplaced mandibular fracture with small anteriorsternal hematoma, POA - Multiple images noted as below with nondisplaced mandibular fracture with small sternal hematoma, as well as chronic findings including L2 and thoracic vertebral and posterior rib fractures from last year.  CT head negative for acute findings. - Patient reported to have uncontrolled pain at intake - PT following, recommending discharge to SNF per  discussion with family - Patient has required no analgesics since intake.  Discontinue IV narcotics, decrease oxycodone  dosing given patient's age and weight -has not required analgesics since single IV Tylenol  dose in the ED on 12/01/2024   Respiratory distress, POA - Patient reported to have poor inspiratory effort in the setting of trauma although notably patient's rib fractures are not acute - Patient has no signs or symptoms of hypoxia or respiratory distress at this time  Ambulatory dysfunction, acute on chronic Chronic vertigo -On meclizine , continue - PT OT following recommending SNF per family discussion  Acute encephalopathy, likely sundowning  Toxic encephalopathy ruled out - Unlikely toxic given patient did not utilize narcotics per chart review - Given patient's age however she is extremely high risk for delirium -continue supportive care  Essential hypertension -Minimally elevated today, increase metoprolol  with caution given patient's age and risk for orthostatic symptoms especially in light of recent fall and chronic vertigo   DVT prophylaxis: SCDs Start: 12/02/24 0338 Place TED hose Start: 12/02/24 0338   Code Status:   Code Status: Full Code  Family Communication: Updated daughter over the phone  Status is: Observation  Dispo: The patient is from: [facility]              Anticipated d/c is to: To be determined              Anticipated d/c date is: 24-hour              Patient currently is medically stable for discharge  Consultants:  None  Procedures:  None  Antimicrobials:  None indicated  Subjective: No acute issues or events overnight, patient continues to be somewhat confused this morning unable to be redirected by  staff and continues to attempt to get out of bed.  She is pleasant however denies any nausea vomiting diarrhea constipation headache fevers chills chest pain shortness of breath or other symptoms at this time and feels well just wants  to go home  Objective: Vitals:   12/03/24 0000 12/03/24 0400 12/03/24 0823 12/03/24 1400  BP: (!) 168/80 (!) 177/86 (!) 143/77 (!) 156/84  Pulse:   92 84  Resp: 18 11 18 20   Temp: (!) 97.5 F (36.4 C) 97.6 F (36.4 C)  98.6 F (37 C)  TempSrc: Oral Oral  Oral  SpO2: 97% 98%  91%  Weight:      Height:       No intake or output data in the 24 hours ending 12/03/24 1530  Filed Weights   12/02/24 0400  Weight: 39.3 kg    Examination:  General: Thin appearing elderly woman attempting to climb over guardrail on bed, pleasantly confused and easily redirectable HEENT: Noted posterior laceration and sutures clean dry intact. Neck:  Without mass or deformity.  Trachea is midline. Lungs:  Clear to auscultate bilaterally without rhonchi, wheeze, or rales. Heart:  Regular rate and rhythm.  Without murmurs, rubs, or gallops. Abdomen:  Soft, nontender, nondistended.  Without guarding or rebound. Extremities: Without cyanosis, clubbing, edema, or obvious deformity. Skin:  Warm and dry, no erythema.  Data Reviewed: I have personally reviewed following labs and imaging studies  CBC: Recent Labs  Lab 12/01/24 1805 12/02/24 1050  WBC 10.7* 10.8*  NEUTROABS 8.2*  --   HGB 13.6 13.1  HCT 41.4 39.6  MCV 101.0* 100.3*  PLT 232 225   Basic Metabolic Panel: Recent Labs  Lab 12/01/24 1805 12/02/24 0931  NA 140 136  K 4.1 4.1  CL 104 104  CO2 28 21*  GLUCOSE 107* 126*  BUN 16 13  CREATININE 0.56 0.65  CALCIUM  9.3 8.4*   GFR: Estimated Creatinine Clearance: 24.9 mL/min (by C-G formula based on SCr of 0.65 mg/dL).   No results found for this or any previous visit (from the past 240 hours).       Radiology Studies: CT CHEST ABDOMEN PELVIS W CONTRAST Result Date: 12/01/2024 EXAM: CT CHEST, ABDOMEN AND PELVIS WITH CONTRAST 12/01/2024 07:24:47 PM TECHNIQUE: CT of the chest, abdomen and pelvis was performed with the administration of 65 mL of iohexol  (OMNIPAQUE ) 300 MG/ML  solution. Multiplanar reformatted images are provided for review. Automated exposure control, iterative reconstruction, and/or weight based adjustment of the mA/kV was utilized to reduce the radiation dose to as low as reasonably achievable. COMPARISON: None available. CLINICAL HISTORY: Polytrauma, blunt. FINDINGS: CHEST: MEDIASTINUM AND LYMPH NODES: The heart is mildly enlarged. There are atherosclerotic calcifications of the aorta and coronary arteries. There are low attenuation nodules in the thyroid gland measuring up to 7 mm. The central airways are clear. No mediastinal, hilar or axillary lymphadenopathy. LUNGS AND PLEURA: There are biapical calcified pleural plaques. There are trace bilateral pleural effusions. There is pleuroparenchymal scarring and blunt lung apices. There is severe emphysema. There is a focal ground glass opacity in the left upper lobe measuring 3.5 x 4.0 cm. No pneumothorax. ABDOMEN AND PELVIS: LIVER: The liver is unremarkable. GALLBLADDER AND BILE DUCTS: Gallbladder is unremarkable. No biliary ductal dilatation. SPLEEN: No acute abnormality. PANCREAS: No acute abnormality. ADRENAL GLANDS: No acute abnormality. KIDNEYS, URETERS AND BLADDER: Small left peripelvic cysts are present in the left kidney. No stones in the kidneys or ureters. No hydronephrosis. No perinephric or periureteral  stranding. Urinary bladder is unremarkable. GI AND BOWEL: Stomach demonstrates no acute abnormality. Rectosigmoid anastomosis is present. There is a large amount of stool throughout the entire colon. The appendix is not visualized. There is no bowel obstruction. REPRODUCTIVE ORGANS: No acute abnormality. PERITONEUM AND RETROPERITONEUM: No ascites. No free air. VASCULATURE: Aorta is normal in caliber. There are atherosclerotic calcifications of the abdominal aorta. ABDOMINAL AND PELVIS LYMPH NODES: No lymphadenopathy. BONES AND SOFT TISSUES: There is acute appearing nondisplaced manubrial fracture with some  anteromediastinal hematoma. There is chronic appearing mid sternal fracture with callus formation. The bones are diffusely osteopenic. There are degenerative changes of both shoulders. There are healed bilateral rib fractures. Posterolateral left 7th and 8th rib fractures are subacute. Impression deformities of T4, T6, T8, T9 and T10 are present. Please see dedicated CT of the thoracic spine for further description. L2 compression fracture is present, favored as chronic but new from the prior examination. Posterior fusion hardware is seen at L4-L5. No focal soft tissue abnormality. IMPRESSION: 1. Acute appearing nondisplaced manubrial fracture with small anteromediastinal hematoma. 2. Severe emphysema. Pulmonary emphysema is an independent risk factor for lung cancer; recommend consideration for evaluation for a low-dose CT lung cancer screening program. 3. Left upper lobe ground-glass opacity measuring 3.5 x 4.0 cm; recommend short-interval chest CT in 3-6 months to assess for persistence or evolution, with consideration of further evaluation if a solid component develops. 4. L2 compression fracture, favored as chronic but new from the prior examination. Electronically signed by: Greig Pique MD 12/01/2024 07:51 PM EST RP Workstation: [id number]   DG Chest Portable 1 View Result Date: 12/01/2024 EXAM: 1 VIEW(S) XRAY OF THE CHEST 12/01/2024 06:19:00 PM COMPARISON: 11/22/2023 CLINICAL HISTORY: uncontrolled HTN FINDINGS: LUNGS AND PLEURA: Hyperinflation. Chronic interstitial changes. No focal pulmonary opacity. No pleural effusion. No pneumothorax. HEART AND MEDIASTINUM: Calcified and tortuous aorta, including along the left lower hemithorax. No acute abnormality of the cardiac and mediastinal silhouettes. BONES AND SOFT TISSUES: Compression deformities in the lower thoracic spine. Multiple bilateral rib fractures, chronic on the left but favored to be acute/subacute on the right. IMPRESSION: 1. Multiple  right-sided rib fractures, favored to be acute/subacute. 2. No acute cardiopulmonary abnormality. Electronically signed by: Pinkie Pebbles MD 12/01/2024 06:28 PM EST RP Workstation: [id number]        Scheduled Meds:  calcium -vitamin D  1 tablet Oral Q breakfast   docusate sodium   100 mg Oral BID   metoprolol  tartrate  25 mg Oral BID   sodium chloride  flush  3 mL Intravenous Q12H   sodium chloride  flush  3 mL Intravenous Q12H   Continuous Infusions:     LOS: 0 days   Time spent:  Elsie JAYSON Montclair, DO [facility]  If 7PM-7AM, please contact night-coverage [email or web address]  12/03/2024, 3:30 PM

## 2024-12-03 NOTE — Progress Notes (Signed)
 Physical Therapy Treatment Patient Details Name: Katie Leonard MRN: [id number] DOB: 1925/12/13 Today's Date: 12/03/2024   History of Present Illness 89 y/o F presenting to ED on 12/13 from [facility] after mechanical fall, with head and upper back/neck pain. Posterior head lac repaired with 5 staples in ED. CT head and C spien negative. CT T spine shows progression of nonacute thoracic vertebral body and posterior rib fxs since 2024. Ct chest with nondisplaced manubrial fx with anteromediastinal hematoma    PMH includes colon CA, glaucoma, insomnia, vertigo, HLD    PT Comments  Pt up in chair on arrival, pleasant and agreeable to session. Pt pleasantly confused throughout session, despite frequent reorientation and with continued L inattention, requiring increased assist and cues to attend to L environment and for safety. Pt requiring CGA to complete transfers sit<>stand and min A to maintain balance and manage RW during gait due to L drift and pt with poor/zero anticipatory correction. Pt performing seated LE exercises for increased strength and ROM. Pt agreeable to time up in chair at end of session. Pt continues to benefit from skilled PT services to progress toward functional mobility goals.     If plan is discharge home, recommend the following: A little help with walking and/or transfers;A little help with bathing/dressing/bathroom;Assistance with cooking/housework;Direct supervision/assist for medications management;Direct supervision/assist for financial management;Assist for transportation;Help with stairs or ramp for entrance;Supervision due to cognitive status   Can travel by private vehicle     Yes  Equipment Recommendations  Other (comment) (defer to next venue of care)    Recommendations for Other Services       Precautions / Restrictions Precautions Precautions: Fall Precaution/Restrictions Comments: impulsive, veers L Restrictions Weight Bearing Restrictions Per Provider  Order: No     Mobility  Bed Mobility Overal bed mobility: Needs Assistance             General bed mobility comments: pt up in chair on arrival and at end of session    Transfers Overall transfer level: Needs assistance Equipment used: Rolling walker (2 wheels) Transfers: Sit to/from Stand Sit to Stand: Min assist           General transfer comment: MinA for balance standing from chair to RW    Ambulation/Gait Ambulation/Gait assistance: Min assist, Contact guard assist Gait Distance (Feet): 125 Feet Assistive device: Rolling walker (2 wheels) Gait Pattern/deviations: Step-through pattern, Decreased step length - right, Decreased step length - left, Decreased stride length, Trunk flexed, Drifts right/left Gait velocity: reduced     General Gait Details: Pt ambulates with a flexed posture and with poor RW management, often pushing it distally to her. Pt veers to the L as well with poor awareness of people and objects, bumping things often and reporting not seeing them. Pt needs cues to stay within RW and to direct her down and back up the hall and around obstacles. min A to consistently avoid obstacles on L   Stairs             Wheelchair Mobility     Tilt Bed    Modified Rankin (Stroke Patients Only)       Balance Overall balance assessment: Needs assistance Sitting-balance support: Feet supported Sitting balance-Leahy Scale: Good Sitting balance - Comments: reaches outside BOS without LOB     Standing balance-Leahy Scale: Poor Standing balance comment: reliant on RW support  Communication Communication Communication: Impaired Factors Affecting Communication: Hearing impaired  Cognition Arousal: Alert Behavior During Therapy: Impulsive, WFL for tasks assessed/performed   PT - Cognitive impairments: Orientation, Awareness, Memory, Attention, Safety/Judgement   Orientation impairments: Place, Time,  Situation                   PT - Cognition Comments: Pt intermittently unaware she is at the hospital already even though she just stated it a moment earlier. She is HOH, which may be preventing her from following cues consistently. She is easily distracted, impulsive, and restless. Poor awareness of her safety and of her surroundings to her L, often bumping into obstacles/people on her L, reporting she did not see them. Following commands: Impaired Following commands impaired: Follows one step commands with increased time    Cueing Cueing Techniques: Verbal cues, Tactile cues  Exercises General Exercises - Lower Extremity Straight Leg Raises: AROM, Right, Left, 10 reps    General Comments General comments (skin integrity, edema, etc.): VSS on RA      Pertinent Vitals/Pain Pain Assessment Pain Assessment: Faces Faces Pain Scale: Hurts a little bit Pain Location: posterior neck Pain Descriptors / Indicators: Discomfort Pain Intervention(s): Monitored during session, Limited activity within patient's tolerance    Home Living                          Prior Function            PT Goals (current goals can now be found in the care plan section) Acute Rehab PT Goals Patient Stated Goal: to go home PT Goal Formulation: With patient Time For Goal Achievement: 12/16/24 Progress towards PT goals: Progressing toward goals    Frequency    Min 2X/week      PT Plan      Co-evaluation              AM-PAC PT 6 Clicks Mobility   Outcome Measure  Help needed turning from your back to your side while in a flat bed without using bedrails?: A Little Help needed moving from lying on your back to sitting on the side of a flat bed without using bedrails?: A Little Help needed moving to and from a bed to a chair (including a wheelchair)?: A Little Help needed standing up from a chair using your arms (e.g., wheelchair or bedside chair)?: A Little Help needed to  walk in hospital room?: A Little Help needed climbing 3-5 steps with a railing? : Total 6 Click Score: 16    End of Session Equipment Utilized During Treatment: Gait belt Activity Tolerance: Patient tolerated treatment well Patient left: with call bell/phone within reach;in chair;with chair alarm set Nurse Communication: Mobility status PT Visit Diagnosis: Unsteadiness on feet (R26.81);Other abnormalities of gait and mobility (R26.89);Muscle weakness (generalized) (M62.81);Difficulty in walking, not elsewhere classified (R26.2);Other symptoms and signs involving the nervous system (R29.898)     Time: 8890-8872 PT Time Calculation (min) (ACUTE ONLY): 18 min  Charges:    $Gait Training: 8-22 mins PT General Charges $$ ACUTE PT VISIT: 1 Visit                     Katie Leonard R. PTA Acute Rehabilitation Services Office: [phone]   Katie Leonard 12/03/2024, 11:39 AM

## 2024-12-03 NOTE — TOC Progression Note (Signed)
 Transition of Care [facility]) - Progression Note    Patient Details  Name: Katie Leonard MRN: [id number] Date of Birth: 1925-12-08  Transition of Care [facility]) CM/SW Contact  Montie LOISE Louder, [place] Phone Number: 12/03/2024, 12:38 PM  Clinical Narrative:     Spoke with patient's daughter,Ann- provided SNF bed offers and medicare.gov star rating by text as requested.  She states she will review and inform CSW of her choice this afternoon.  TOC will continue to follow and assist with discharge planning.  Montie Louder, MSW, LCSW Clinical Social Worker    Expected Discharge Plan: Skilled Nursing Facility Barriers to Discharge: Continued Medical Work up, English As A Second Language Teacher, SNF Pending bed offer               Expected Discharge Plan and Services In-house Referral: Clinical Social Work     Living arrangements for the past 2 months: Assisted Living Facility                                       Social Drivers of Health (SDOH) Interventions SDOH Screenings   Food Insecurity: No Food Insecurity (12/02/2024)  Housing: Low Risk (12/02/2024)  Transportation Needs: No Transportation Needs (12/02/2024)  Utilities: Not At Risk (12/02/2024)  Financial Resource Strain: Low Risk (11/07/2023)   Received from [facility]  Physical Activity: Insufficiently Active (11/07/2023)   Received from [facility]  Social Connections: Moderately Integrated (11/07/2023)   Received from [facility]  Stress: No Stress Concern Present (11/07/2023)   Received from [facility]  Tobacco Use: Medium Risk (12/02/2024)    Readmission Risk Interventions     No data to display

## 2024-12-03 NOTE — TOC Progression Note (Addendum)
 Transition of Care [facility]) - Progression Note    Patient Details  Name: Katie Leonard MRN: [id number] Date of Birth: 11-Mar-1925  Transition of Care [facility]) CM/SW Contact  Montie LOISE Louder, [place] Phone Number: 12/03/2024, 3:15 PM  Clinical Narrative:     Update 3:32 pm- patent's daughter called back- ask to cancel SNF requested. She was informed by [facility] they can meet her needs and she can return once stable.   CSW contacted Therisa @ [phone] w/ [facility] - she confirmed once the patient is stable for discharge to email her the d/c summary, FL2 and order for wheelchair.   TOC will continue to follow and assist with discharge planning.   Montie Louder, MSW, LCSW Clinical Social Worker     3:15 pm CSW spoke with patient's daughter, she accepted bed offer with [facility]. CSW explained the insurance and SNF auth process. All questions answered.   [facility] confirmed bed offer. TOC will start insurance auth today.   Montie Louder, MSW, LCSW Clinical Social Worker    Expected Discharge Plan: Skilled Nursing Facility Barriers to Discharge: Continued Medical Work up, English As A Second Language Teacher, SNF Pending bed offer               Expected Discharge Plan and Services In-house Referral: Clinical Social Work     Living arrangements for the past 2 months: Assisted Living Facility                                       Social Drivers of Health (SDOH) Interventions SDOH Screenings   Food Insecurity: No Food Insecurity (12/02/2024)  Housing: Low Risk (12/02/2024)  Transportation Needs: No Transportation Needs (12/02/2024)  Utilities: Not At Risk (12/02/2024)  Financial Resource Strain: Low Risk (11/07/2023)   Received from [facility]  Physical Activity: Insufficiently Active (11/07/2023)   Received from [facility]  Social Connections: Moderately Integrated (11/07/2023)   Received from [facility]  Stress: No Stress Concern Present (11/07/2023)    Received from [facility]  Tobacco Use: Medium Risk (12/02/2024)    Readmission Risk Interventions     No data to display

## 2024-12-03 NOTE — TOC CAGE-AID Note (Signed)
 Transition of Care [facility]) - CAGE-AID Screening   Patient Details  Name: Karl Knarr MRN: [id number] Date of Birth: 08/04/1925  Transition of Care [facility]) CM/SW Contact:    Corda Shutt E Grabiela Wohlford, LCSW Phone Number: 12/03/2024, 9:59 AM   Clinical Narrative:    CAGE-AID Screening: Substance Abuse Screening unable to be completed due to: : Patient unable to participate

## 2024-12-03 NOTE — Progress Notes (Signed)
 Mobility Specialist Progress Note;   12/03/24 1210  Mobility  Activity Stood at bedside (chair)  Level of Assistance Minimal assist, patient does 75% or more  Assistive Device Front wheel walker  Activity Response Tolerated fair  Mobility Referral Yes  Mobility visit 1 Mobility  Mobility Specialist Start Time (ACUTE ONLY) 1210  Mobility Specialist Stop Time (ACUTE ONLY) 1222  Mobility Specialist Time Calculation (min) (ACUTE ONLY) 12 min   Pt requesting assistance d/t incontinence in chair. Required MinA to stand from chair and stabilize pt for pericare and gown change. Pt satisfied with staying in chair at this time and deferred any further mobility. Pt returned back to chair and left with all needs met, alarm on.   Lauraine Erm Mobility Specialist Please contact via SecureChat or [employer] [phone]

## 2024-12-03 NOTE — Progress Notes (Signed)
 Mobility Specialist Progress Note:    12/03/24 1500  Mobility  Activity Pivoted/transferred from bed to chair  Level of Assistance Minimal assist, patient does 75% or more  Assistive Device Front wheel walker  Distance Ambulated (ft) 5 ft  Activity Response Tolerated well  Mobility Referral Yes  Mobility visit 1 Mobility  Mobility Specialist Start Time (ACUTE ONLY) 1450  Mobility Specialist Stop Time (ACUTE ONLY) 1505  Mobility Specialist Time Calculation (min) (ACUTE ONLY) 15 min   Pt received in chair agreeable to mobility. Pt required MinA for STS and during ambulation back to bed d/t posterior lean. No c/o throughout. Required Min VC. Left in bed w/ call bell and personal belongings in reach. All needs met. Bed alarm on. RN aware.  Thersia Minder Mobility Specialist  Please contact vis Secure Chat or  Rehab Office [phone]

## 2024-12-03 NOTE — Plan of Care (Signed)

## 2024-12-03 NOTE — Progress Notes (Signed)
 Mobility Specialist Progress Note;   12/03/24 0948  Mobility  Activity Pivoted/transferred from bed to chair  Level of Assistance Contact guard assist, steadying assist  Assistive Device Front wheel walker  Distance Ambulated (ft) 5 ft  Activity Response Tolerated well  Mobility Referral Yes  Mobility visit 1 Mobility  Mobility Specialist Start Time (ACUTE ONLY) [id number]  Mobility Specialist Stop Time (ACUTE ONLY) 1001  Mobility Specialist Time Calculation (min) (ACUTE ONLY) 13 min   Pt agreeable to mobility. Required MinG assistance to stand from EoB and safely transfer to chair. VSS throughout. VC required for pts safety. Pt left comfortably in chair with all needs met. Alarm on. RN notified.   Lauraine Erm Mobility Specialist Please contact via SecureChat or [employer] [phone]

## 2024-12-04 MED ORDER — METOPROLOL TARTRATE 25 MG PO TABS: 25.0000 mg | ORAL_TABLET | Freq: Two times a day (BID) | ORAL | 0 refills | Status: AC

## 2024-12-04 MED ORDER — MECLIZINE HCL 12.5 MG PO TABS: 6.2500 mg | ORAL_TABLET | Freq: Three times a day (TID) | ORAL | 0 refills | Status: AC | PRN

## 2024-12-04 NOTE — TOC Transition Note (Signed)
 Transition of Care [facility]) - Discharge Note   Patient Details  Name: Katie Leonard MRN: [id number] Date of Birth: 1925/02/14  Transition of Care [facility]) CM/SW Contact:  Katie LOISE Louder, LCSW Phone Number: 12/04/2024, 2:42 PM   Clinical Narrative:     Patient will Discharge to: [facility] Discharge Date: 12/04/2024 Family Notified: daughter Transport By: ROME Beals, HH orders emailed to [facility] @ as requested.   Per MD patient is ready for discharge. RN, patient, and facility notified of discharge. Discharge Summary sent to facility. RN given number for report [phone]. Ambulance transport requested for patient.   Clinical Social Worker signing off.  Katie Leonard, MSW, LCSW Clinical Social Worker     Final next level of care: Skilled Nursing Facility Barriers to Discharge: Barriers Resolved   Patient Goals and CMS Choice            Discharge Placement              Patient chooses bed at:  ([facility]) Patient to be transferred to facility by: [facility] Name of family member notified: daughter Patient and family notified of of transfer: 12/04/24  Discharge Plan and Services Additional resources added to the After Visit Summary for   In-house Referral: Clinical Social Work              DME Arranged: Government social research officer   Date DME Agency Contacted: 12/04/24   Representative spoke with at DME Agency: ALF to arrange wheelchair and delivery to ALF [facility] Arranged: PT, OT   Date HH Agency Contacted: 12/04/24   Representative spoke with at [facility] Agency: ALF to arrange HHPT/OT follow up  Social Drivers of Health (SDOH) Interventions SDOH Screenings   Food Insecurity: No Food Insecurity (12/02/2024)  Housing: Low Risk (12/02/2024)  Transportation Needs: No Transportation Needs (12/02/2024)  Utilities: Not At Risk (12/02/2024)  Financial Resource Strain: Low Risk (11/07/2023)   Received from [facility]  Physical Activity: Insufficiently Active  (11/07/2023)   Received from [facility]  Social Connections: Moderately Integrated (11/07/2023)   Received from [facility]  Stress: No Stress Concern Present (11/07/2023)   Received from [facility]  Tobacco Use: Medium Risk (12/02/2024)     Readmission Risk Interventions     No data to display

## 2024-12-04 NOTE — Discharge Summary (Signed)
 Physician Discharge Summary  Katie Leonard FMW:[id number] DOB: [birth year] DOA: 12/01/2024  PCP: Sophronia Ozell BROCKS, MD  Admit date: 12/01/2024 Discharge date: 12/04/2024  Admitted From: Home Disposition: Home, assisted living  Recommendations for Outpatient Follow-up:  Follow up with PCP in 1-2 weeks Remove sutures in 7 to 10 days pending wound healing keep area clean dry, avoid immersion  Discharge Condition: Stable CODE STATUS: Full Diet recommendation: As tolerated  Brief/Interim Summary: Patient is a 89 year old female who presents from [facility] after a fall with staff present with worsening neck and back pain with also notable head strike during fall.  Posterior scalp laceration repaired in the ED C-spine and CT head negative for any acute findings.  CT thoracic spine shows progression of nonacute appearing thoracic vertebral body and posterior rib fracture from 2024. General surgery consulted at intake regarding manubrial fracture with mediastinal hematoma, given currently well-controlled pain there is no indication for intervention at this time.   Patient admitted to our facility for apparently worsening respiratory status and ambulatory dysfunction as well as reported uncontrolled pain.   Patient continues to improve over the past 24 hours no further episodes of respiratory distress uncontrolled pain or other symptoms requiring intervention (other than confusion/sundowning as below).  She is now being evaluated for placement at skilled nursing facility, otherwise she is medically stable for discharge simply awaiting placement at this time.  Discharge Diagnoses:  Principal Problem:   Closed fracture of manubrium Active Problems:   History of colon cancer   Essential hypertension   Hyperlipidemia   Chronic vertigo   Fall at home, initial encounter  Mechanical fall at facility Scalp laceration status post 5 staples, POA Nondisplaced mandibular fracture with small  anteriorsternal hematoma, POA - Multiple images noted as below with nondisplaced mandibular fracture with small sternal hematoma, as well as chronic findings including L2 and thoracic vertebral and posterior rib fractures from last year.  CT head negative for acute findings. - Patient reported to have uncontrolled pain at intake - PT following, recommending discharge to SNF per discussion with family - Patient has required no analgesics since intake.  Discontinue IV narcotics, decrease oxycodone  dosing given patient's age and weight -has not required analgesics since single IV Tylenol  dose in the ED on 12/01/2024   Respiratory distress, POA - Patient reported to have poor inspiratory effort in the setting of trauma although notably patient's rib fractures are not acute - Patient has no signs or symptoms of hypoxia or respiratory distress at this time -remains without hypoxia   Ambulatory dysfunction, acute on chronic Chronic vertigo -On meclizine , continue - PT OT following recommending SNF per family discussion   Acute encephalopathy, likely sundowning  Toxic encephalopathy ruled out - Unlikely toxic given patient did not utilize narcotics per chart review - Given patient's age however she is extremely high risk for delirium -continue supportive care   Essential hypertension -Minimally elevated today, increase metoprolol  with caution given patient's age and risk for orthostatic symptoms especially in light of recent fall and chronic vertigo   Discharge Instructions  Discharge Instructions     Call MD for:  difficulty breathing, headache or visual disturbances   Complete by: As directed    Call MD for:  extreme fatigue   Complete by: As directed    Call MD for:  hives   Complete by: As directed    Call MD for:  persistant dizziness or light-headedness   Complete by: As directed    Call MD for:  persistant nausea and vomiting   Complete by: As directed    Call MD for:  severe  uncontrolled pain   Complete by: As directed    Call MD for:  temperature >100.4   Complete by: As directed    Increase activity slowly   Complete by: As directed    No wound care   Complete by: As directed       Allergies as of 12/04/2024       Reactions   Erythromycin Hives   Allergy not listed on MAR    Other Other (See Comments)   HAS A LOT OF SKIN ALLERGIES. Allergy not listed on [facility]    [facility] Other (See Comments)   HAS A SKIN REACTION TO THIS- tested allergic. Allergy not listed on MAR         Medication List     STOP taking these medications    aspirin EC 325 MG tablet   metoprolol  succinate 25 MG 24 hr tablet Commonly known as: TOPROL -XL       TAKE these medications    CALCIUM  600 + D PO Take 1 tablet by mouth daily.   CRANBERRY-VITAMIN C PO Take 1 capsule by mouth daily.   meclizine  12.5 MG tablet Commonly known as: ANTIVERT  Take 0.5 tablets (6.25 mg total) by mouth 3 (three) times daily as needed for dizziness. What changed:  medication strength how much to take   metoprolol  tartrate 25 MG tablet Commonly known as: LOPRESSOR  Take 1 tablet (25 mg total) by mouth 2 (two) times daily.   mirtazapine 7.5 MG tablet Commonly known as: REMERON Take 7.5 mg by mouth at bedtime.   nitroGLYCERIN 0.3 MG SL tablet Commonly known as: NITROSTAT Place 0.3 mg under the tongue every 5 (five) minutes as needed for chest pain.   polyethylene glycol 17 g packet Commonly known as: MIRALAX / GLYCOLAX Take 17 g by mouth daily as needed for mild constipation, moderate constipation or severe constipation.   PRESERVISION AREDS 2 PO Take 2 capsules by mouth daily.   Centrum Silver 50+Women Tabs Take 1 tablet by mouth daily.   sennosides-docusate sodium  8.6-50 MG tablet Commonly known as: SENOKOT-S Take 2 tablets by mouth daily.   timolol  0.25 % ophthalmic solution Commonly known as: BETIMOL  Place 1 drop into both eyes 2 (two) times daily.   travoprost   (benzalkonium) 0.004 % ophthalmic solution Commonly known as: TRAVATAN  Place 1 drop into both eyes at bedtime.               Durable Medical Equipment  (From admission, onward)           Start     Ordered   12/03/24 1637  For home use only DME lightweight manual wheelchair with seat cushion  Once       Comments: Patient suffers from ambulatory dysfunction which impairs their ability to perform daily activities like bathing, dressing, feeding, grooming, and toileting in the home.  A cane, crutch, or walker will not resolve  issue with performing activities of daily living. A wheelchair will allow patient to safely perform daily activities. Patient is not able to propel themselves in the home using a standard weight wheelchair due to arm weakness, endurance, and general weakness. Patient can self propel in the lightweight wheelchair. Length of need Lifetime. Accessories: elevating leg rests (ELRs), wheel locks, extensions and anti-tippers.   12/03/24 1637            Follow-up Information  Sophronia Ozell BROCKS, MD.   Specialty: Family Medicine Contact information: [address] [place] [address] [phone]                Allergies[1]  Consultations: None  Procedures/Studies: CT CHEST ABDOMEN PELVIS W CONTRAST Result Date: 12/01/2024 EXAM: CT CHEST, ABDOMEN AND PELVIS WITH CONTRAST 12/01/2024 07:24:47 PM TECHNIQUE: CT of the chest, abdomen and pelvis was performed with the administration of 65 mL of iohexol  (OMNIPAQUE ) 300 MG/ML solution. Multiplanar reformatted images are provided for review. Automated exposure control, iterative reconstruction, and/or weight based adjustment of the mA/kV was utilized to reduce the radiation dose to as low as reasonably achievable. COMPARISON: None available. CLINICAL HISTORY: Polytrauma, blunt. FINDINGS: CHEST: MEDIASTINUM AND LYMPH NODES: The heart is mildly enlarged. There are atherosclerotic  calcifications of the aorta and coronary arteries. There are low attenuation nodules in the thyroid gland measuring up to 7 mm. The central airways are clear. No mediastinal, hilar or axillary lymphadenopathy. LUNGS AND PLEURA: There are biapical calcified pleural plaques. There are trace bilateral pleural effusions. There is pleuroparenchymal scarring and blunt lung apices. There is severe emphysema. There is a focal ground glass opacity in the left upper lobe measuring 3.5 x 4.0 cm. No pneumothorax. ABDOMEN AND PELVIS: LIVER: The liver is unremarkable. GALLBLADDER AND BILE DUCTS: Gallbladder is unremarkable. No biliary ductal dilatation. SPLEEN: No acute abnormality. PANCREAS: No acute abnormality. ADRENAL GLANDS: No acute abnormality. KIDNEYS, URETERS AND BLADDER: Small left peripelvic cysts are present in the left kidney. No stones in the kidneys or ureters. No hydronephrosis. No perinephric or periureteral stranding. Urinary bladder is unremarkable. GI AND BOWEL: Stomach demonstrates no acute abnormality. Rectosigmoid anastomosis is present. There is a large amount of stool throughout the entire colon. The appendix is not visualized. There is no bowel obstruction. REPRODUCTIVE ORGANS: No acute abnormality. PERITONEUM AND RETROPERITONEUM: No ascites. No free air. VASCULATURE: Aorta is normal in caliber. There are atherosclerotic calcifications of the abdominal aorta. ABDOMINAL AND PELVIS LYMPH NODES: No lymphadenopathy. BONES AND SOFT TISSUES: There is acute appearing nondisplaced manubrial fracture with some anteromediastinal hematoma. There is chronic appearing mid sternal fracture with callus formation. The bones are diffusely osteopenic. There are degenerative changes of both shoulders. There are healed bilateral rib fractures. Posterolateral left 7th and 8th rib fractures are subacute. Impression deformities of T4, T6, T8, T9 and T10 are present. Please see dedicated CT of the thoracic spine for further  description. L2 compression fracture is present, favored as chronic but new from the prior examination. Posterior fusion hardware is seen at L4-L5. No focal soft tissue abnormality. IMPRESSION: 1. Acute appearing nondisplaced manubrial fracture with small anteromediastinal hematoma. 2. Severe emphysema. Pulmonary emphysema is an independent risk factor for lung cancer; recommend consideration for evaluation for a low-dose CT lung cancer screening program. 3. Left upper lobe ground-glass opacity measuring 3.5 x 4.0 cm; recommend short-interval chest CT in 3-6 months to assess for persistence or evolution, with consideration of further evaluation if a solid component develops. 4. L2 compression fracture, favored as chronic but new from the prior examination. Electronically signed by: Greig Pique MD 12/01/2024 07:51 PM EST RP Workstation: [id number]   DG Chest Portable 1 View Result Date: 12/01/2024 EXAM: 1 VIEW(S) XRAY OF THE CHEST 12/01/2024 06:19:00 PM COMPARISON: 11/22/2023 CLINICAL HISTORY: uncontrolled HTN FINDINGS: LUNGS AND PLEURA: Hyperinflation. Chronic interstitial changes. No focal pulmonary opacity. No pleural effusion. No pneumothorax. HEART AND MEDIASTINUM: Calcified and tortuous aorta, including along the left lower hemithorax.  No acute abnormality of the cardiac and mediastinal silhouettes. BONES AND SOFT TISSUES: Compression deformities in the lower thoracic spine. Multiple bilateral rib fractures, chronic on the left but favored to be acute/subacute on the right. IMPRESSION: 1. Multiple right-sided rib fractures, favored to be acute/subacute. 2. No acute cardiopulmonary abnormality. Electronically signed by: Pinkie Pebbles MD 12/01/2024 06:28 PM EST RP Workstation: [id number]   CT Thoracic Spine Wo Contrast Result Date: 12/01/2024 EXAM: CT THORACIC SPINE WITHOUT CONTRAST 12/01/2024 11:50:50 AM TECHNIQUE: CT of the thoracic spine was performed without the administration of intravenous  contrast. Multiplanar reformatted images are provided for review. Automated exposure control, iterative reconstruction, and/or weight based adjustment of the mA/kV was utilized to reduce the radiation dose to as low as reasonably achievable. COMPARISON: Thoracic spine CT 11/22/2023, Cervical spine CT 09/24/2024, Cervical spine CT 12/01/2024 (reported separately). CLINICAL HISTORY: 89 year old female with mid-back pain, status post fall this morning. FINDINGS: Normal thoracic spine segmentation. BONES AND ALIGNMENT: Chronically exaggerated thoracic kyphosis in the setting of multilevel chronic thoracic spinal compression fractures. Stable mild compression fractures at T1, T3, and T9. Stable moderate T6 compression fracture. Stable severe T10 compression fracture (vertebra plana). A mild to moderate T4 compression fracture is new from the CT 11/22/2023 but stable from cervical spine CT 09/24/2024. Moderate to severe T8 compression fracture is new since 11/22/2023 but sclerotic and appears nonacute. T2, T5, T7, T11, T12, and the visible upper lumbar vertebral bodies remain intact. Chronic posterior rib fractures bilaterally, and some of these are new from 11/22/2023 including healing left 7th and 8th posterolateral rib fractures. Healing left posterior 10th rib fracture also noted. No obvious acute posterior rib fracture. No acute osseous abnormality identified in the thoracic spine. DEGENERATIVE CHANGES: Multilevel chronic thoracic spinal compression fractures are present, as detailed in the BONES AND ALIGNMENT section. SOFT TISSUES: Thoracic paraspinal soft tissues remain within normal limits. Advanced chronic aortic calcified atherosclerosis. Negative visible non-contrast upper abdominal viscera. No pericardial or pleural effusion. Chronic severe emphysema. Stable confluent and partially calcified apical lung scarring. Stable chronic architectural distortion lateral to the left hilum on series 3 image 53.  IMPRESSION: 1. Progression of non-acute appearing thoracic vertebral body and posterior rib fractures since 2024 Thoracic CT. But No acute osseous abnormality identified in the thoracic spine. 2. Advanced emphysema and calcified aortic atherosclerosis. Electronically signed by: Helayne Hurst MD 12/01/2024 12:12 PM EST RP Workstation: [id number]   CT Cervical Spine Wo Contrast Result Date: 12/01/2024 EXAM: CT CERVICAL SPINE WITHOUT CONTRAST 12/01/2024 11:50:50 AM TECHNIQUE: CT of the cervical spine was performed without the administration of intravenous contrast. Multiplanar reformatted images are provided for review. Automated exposure control, iterative reconstruction, and/or weight based adjustment of the mA/kV was utilized to reduce the radiation dose to as low as reasonably achievable. COMPARISON: Head CT reported separately today. Cervical spine CT 09/24/2024. CLINICAL HISTORY: Patient is a 89 year old female. Status post fall this morning striking the back of the head. FINDINGS: BONES AND ALIGNMENT: Stable cervical lordosis. Multilevel degenerative spondylolisthesis from C5-C6 to C7-T1. Degenerative ankylosis of the C2-C3 posterior elements redemonstrated. Chronic very severe C1-C2 degeneration asymmetric to the left. Stable visible upper thoracic levels including chronic appearing compression fractures of T1, T3, T4. No acute fracture or traumatic malalignment. DEGENERATIVE CHANGES: Chronic severe disc and endplate degeneration at C5-C6 and C6-C7 including vacuum disc. Chronic lower cervical spinal stenosis suspected and appears stable by CT. SOFT TISSUES: Bulky left carotid calcified atherosclerosis. Chronic confluent and partially calcified apical lung scarring. Partially visible upper lung  emphysema appears moderate to severe. No prevertebral soft tissue swelling. IMPRESSION: 1. No acute traumatic injury identified in the cervical spine. 2. Stable advanced chronic cervical spine degeneration superimposed  on C2-C3 degenerative ankylosis. 3. Chronic upper thoracic compression fractures, lung scarring, emphysema. Electronically signed by: Helayne Hurst MD 12/01/2024 12:04 PM EST RP Workstation: [id number]   CT Head Wo Contrast Result Date: 12/01/2024 EXAM: CT HEAD WITHOUT 12/01/2024 11:50:50 AM TECHNIQUE: CT of the head was performed without the administration of intravenous contrast. Automated exposure control, iterative reconstruction, and/or weight based adjustment of the mA/kV was utilized to reduce the radiation dose to as low as reasonably achievable. COMPARISON: Head CT 09/24/2024. CLINICAL HISTORY: 89 year old female status post fall this morning striking the back of the head, with pain. FINDINGS: BRAIN AND VENTRICLES: No acute intracranial hemorrhage. No mass effect or midline shift. No extra-axial fluid collection. No evidence of acute infarct. No hydrocephalus. Stable brain volume. Patchy and confluent chronic bilateral cerebral white matter hypodensity appears stable. Small area of chronic encephalomalacia in the medial left parietal lobe is stable. Stable gray white differentiation. Calcified atherosclerosis at the skull base. No suspicious intracranial vascular hyperdensity. ORBITS: No acute abnormality. SINUSES AND MASTOIDS: Chronic right maxillary sinusitis with mucoperiosteal thickening. Other paranasal sinuses, tympanic cavities, and mastoids remain well aerated. SOFT TISSUES AND SKULL: Broad based posterior scalp soft tissue swelling just above the occipital protuberance. Trace skin defect there. Underlying skull appears stable and intact. No acute skull fracture. IMPRESSION: 1. Posterior scalp soft tissue injury. 2. No acute intracranial abnormality. Electronically signed by: Helayne Hurst MD 12/01/2024 12:00 PM EST RP Workstation: [id number]     Subjective: No acute issues or events overnight   Discharge Exam: Vitals:   12/04/24 0821 12/04/24 1125  BP: (!) 142/79 131/71  Pulse: 84 76   Resp:  20  Temp:  98.5 F (36.9 C)  SpO2:  91%   Vitals:   12/04/24 0313 12/04/24 0740 12/04/24 0821 12/04/24 1125  BP: (!) 143/80 (!) 142/79 (!) 142/79 131/71  Pulse: 78 83 84 76  Resp: 18 20  20   Temp: 98.6 F (37 C) 98.1 F (36.7 C)  98.5 F (36.9 C)  TempSrc: Oral Oral  Oral  SpO2: 91% 92%  91%  Weight:      Height:        General: Pt is alert, awake, not in acute distress Cardiovascular: RRR, S1/S2 +, no rubs, no gallops Respiratory: CTA bilaterally, no wheezing, no rhonchi Abdominal: Soft, NT, ND, bowel sounds + Extremities: no edema, no cyanosis Skin: Noted laceration posterior scalp status post sutures    The results of significant diagnostics from this hospitalization (including imaging, microbiology, ancillary and laboratory) are listed below for reference.     Microbiology: No results found for this or any previous visit (from the past 240 hours).   Labs: BNP (last 3 results) No results for input(s): BNP in the last 8760 hours. Basic Metabolic Panel: Recent Labs  Lab 12/01/24 1805 12/02/24 0931  NA 140 136  K 4.1 4.1  CL 104 104  CO2 28 21*  GLUCOSE 107* 126*  BUN 16 13  CREATININE 0.56 0.65  CALCIUM  9.3 8.4*   Liver Function Tests: Recent Labs  Lab 12/02/24 0931  AST 38  ALT 23  ALKPHOS 79  BILITOT 1.0  PROT 6.4*  ALBUMIN 3.4*   No results for input(s): LIPASE, AMYLASE in the last 168 hours. No results for input(s): AMMONIA in the last 168 hours. CBC: Recent Labs  Lab 12/01/24 1805 12/02/24 1050  WBC 10.7* 10.8*  NEUTROABS 8.2*  --   HGB 13.6 13.1  HCT 41.4 39.6  MCV 101.0* 100.3*  PLT 232 225   Cardiac Enzymes: No results for input(s): CKTOTAL, CKMB, CKMBINDEX, TROPONINI in the last 168 hours. BNP: Invalid input(s): POCBNP CBG: No results for input(s): GLUCAP in the last 168 hours. D-Dimer No results for input(s): DDIMER in the last 72 hours. Hgb A1c No results for input(s): HGBA1C in the last  72 hours. Lipid Profile No results for input(s): CHOL, HDL, LDLCALC, TRIG, CHOLHDL, LDLDIRECT in the last 72 hours. Thyroid function studies No results for input(s): TSH, T4TOTAL, T3FREE, THYROIDAB in the last 72 hours.  Invalid input(s): FREET3 Anemia work up No results for input(s): VITAMINB12, FOLATE, FERRITIN, TIBC, IRON, RETICCTPCT in the last 72 hours. Urinalysis    Component Value Date/Time   COLORURINE YELLOW 11/22/2023 1818   APPEARANCEUR CLEAR 11/22/2023 1818   LABSPEC 1.011 11/22/2023 1818   PHURINE 6.0 11/22/2023 1818   GLUCOSEU NEGATIVE 11/22/2023 1818   HGBUR NEGATIVE 11/22/2023 1818   BILIRUBINUR NEGATIVE 11/22/2023 1818   KETONESUR 20 (A) 11/22/2023 1818   PROTEINUR NEGATIVE 11/22/2023 1818   NITRITE NEGATIVE 11/22/2023 1818   LEUKOCYTESUR SMALL (A) 11/22/2023 1818   Sepsis Labs Recent Labs  Lab 12/01/24 1805 12/02/24 1050  WBC 10.7* 10.8*   Microbiology No results found for this or any previous visit (from the past 240 hours).   Time coordinating discharge: Over 30 minutes  SIGNED:   Elsie JAYSON [place], DO [facility] 12/04/2024, 1:27 PM Pager   If 7PM-7AM, please contact night-coverage [email or web address]     [1]  Allergies Allergen Reactions   Erythromycin Hives    Allergy not listed on MAR    Other Other (See Comments)    HAS A LOT OF SKIN ALLERGIES. Allergy not listed on [facility]    [facility] Other (See Comments)    HAS A SKIN REACTION TO THIS- tested allergic. Allergy not listed on [facility]

## 2024-12-04 NOTE — NC FL2 (Addendum)
 [place]  MEDICAID FL2 LEVEL OF CARE FORM     IDENTIFICATION  Patient Name: Katie Leonard Birthdate: 08/04/1925 Sex: female Admission Date (Current Location): 12/01/2024  [facility] and [place] Number:  Producer, Television/film/video and Address:  [facility] [place], [address] [place], [place] [address]      Provider Number: [id number]  Attending Physician Name and Address:  Lue Elsie BROCKS, MD  Relative Name and Phone Number:  Jenkins Schooling  Daughter,  [phone]    Current Level of Care: Hospital Recommended Level of Care: Assisted Living Facility Prior Approval Number:    Date Approved/Denied:   PASRR Number: [id number]  Discharge Plan: Other (Comment) (ALF)    Current Diagnoses: Patient Active Problem List   Diagnosis Date Noted   History of colon cancer 12/02/2024   Essential hypertension 12/02/2024   Hyperlipidemia 12/02/2024   Chronic vertigo 12/02/2024   Fall at home, initial encounter 12/02/2024   Closed fracture of manubrium 12/01/2024    Orientation RESPIRATION BLADDER Height & Weight     Self  Normal Continent Weight: 86 lb 10.3 oz (39.3 kg) Height:  5' 3 (160 cm)  BEHAVIORAL SYMPTOMS/MOOD NEUROLOGICAL BOWEL NUTRITION STATUS      Continent Diet (regular)  AMBULATORY STATUS COMMUNICATION OF NEEDS Skin   Limited Assist Verbally Skin abrasions (head,arm,sacrum,back, wound posterior head)                       Personal Care Assistance Level of Assistance  Bathing, Feeding, Dressing Bathing Assistance: Limited assistance Feeding assistance: Independent Dressing Assistance: Limited assistance     Functional Limitations Info  Sight, Hearing, Speech Sight Info: Impaired Hearing Info: Impaired Speech Info: Adequate    SPECIAL CARE FACTORS FREQUENCY  PT (By licensed PT), OT (By licensed OT)     PT Frequency: 5x per week OT Frequency: 5x per week            Contractures Contractures Info: Not present    Additional  Factors Info  Code Status, Allergies Code Status Info: FULL Allergies Info: Erythromycin Not Specified Allergy Hives Allergy not listed on MAR  Other Not Specified Allergy Other (See Comments) HAS A LOT OF SKIN ALLERGIES. Allergy not listed on [facility]  [place] Not Specified Allergy Other (See Comments) HAS A SKIN REACTION TO THIS- tested allergic. Allergy not listed on MAR           Current Medications (12/04/2024):  This is the current hospital active medication list Current Facility-Administered Medications  Medication Dose Route Frequency Provider Last Rate Last Admin   acetaminophen  (TYLENOL ) tablet 650 mg  650 mg Oral Q6H PRN Sundil, Subrina, MD       Or   acetaminophen  (TYLENOL ) suppository 650 mg  650 mg Rectal Q6H PRN Sundil, Subrina, MD       calcium -vitamin D (OSCAL WITH D) 500-5 MG-MCG per tablet 1 tablet  1 tablet Oral Q breakfast Sundil, Subrina, MD   1 tablet at 12/04/24 9178   docusate sodium  (COLACE) capsule 100 mg  100 mg Oral BID Sundil, Subrina, MD   100 mg at 12/02/24 2124   haloperidol  lactate (HALDOL ) injection 1 mg  1 mg Intravenous Q6H PRN Sundil, Subrina, MD   1 mg at 12/02/24 2124   hydrALAZINE  (APRESOLINE ) injection 10 mg  10 mg Intravenous Q6H PRN Sundil, Subrina, MD   10 mg at 12/03/24 0700   meclizine  (ANTIVERT ) tablet 12.5 mg  12.5 mg Oral TID PRN Sundil, Subrina,  MD       metoprolol  tartrate (LOPRESSOR ) tablet 25 mg  25 mg Oral BID Lue Elsie BROCKS, MD   25 mg at 12/04/24 9178   ondansetron  (ZOFRAN ) tablet 4 mg  4 mg Oral Q6H PRN Sundil, Subrina, MD       Or   ondansetron  (ZOFRAN ) injection 4 mg  4 mg Intravenous Q6H PRN Sundil, Subrina, MD   4 mg at 12/02/24 2124   oxyCODONE  (Oxy IR/ROXICODONE ) immediate release tablet 2.5 mg  2.5 mg Oral Q6H PRN Lue Elsie BROCKS, MD       sodium chloride  flush (NS) 0.9 % injection 3 mL  3 mL Intravenous Q12H Sundil, Subrina, MD   3 mL at 12/02/24 2125   sodium chloride  flush (NS) 0.9 % injection 3 mL  3 mL Intravenous Q12H  Sundil, Subrina, MD   3 mL at 12/04/24 0737   sodium chloride  flush (NS) 0.9 % injection 3 mL  3 mL Intravenous PRN Sundil, Subrina, MD         Discharge Medications: CALCIUM  600 + D PO Take 1 tablet by mouth daily.    CRANBERRY-VITAMIN C PO Take 1 capsule by mouth daily.    meclizine  12.5 MG tablet Commonly known as: ANTIVERT  Take 0.5 tablets (6.25 mg total) by mouth 3 (three) times daily as needed for dizziness. What changed:  medication strength how much to take    metoprolol  tartrate 25 MG tablet Commonly known as: LOPRESSOR  Take 1 tablet (25 mg total) by mouth 2 (two) times daily.    mirtazapine 7.5 MG tablet Commonly known as: REMERON Take 7.5 mg by mouth at bedtime.    nitroGLYCERIN 0.3 MG SL tablet Commonly known as: NITROSTAT Place 0.3 mg under the tongue every 5 (five) minutes as needed for chest pain.    polyethylene glycol 17 g packet Commonly known as: MIRALAX / GLYCOLAX Take 17 g by mouth daily as needed for mild constipation, moderate constipation or severe constipation.    PRESERVISION AREDS 2 PO Take 2 capsules by mouth daily.    Centrum Silver 50+Women Tabs Take 1 tablet by mouth daily.    sennosides-docusate sodium  8.6-50 MG tablet Commonly known as: SENOKOT-S Take 2 tablets by mouth daily.    timolol  0.25 % ophthalmic solution Commonly known as: BETIMOL  Place 1 drop into both eyes 2 (two) times daily.    travoprost  (benzalkonium) 0.004 % ophthalmic solution Commonly known as: TRAVATAN  Place 1 drop into both eyes at bedtime.      Relevant Imaging Results:  Relevant Lab Results:   Additional Information SSN [id number]  Montie LOISE Louder, LCSW

## 2024-12-04 NOTE — Progress Notes (Signed)
 Patient discharged from unit via [facility] medications and property returned to designated person. Discharge report given to [facility] staff via phone  instructions reviewed and staff questions answered.

## 2024-12-04 NOTE — Plan of Care (Signed)

## 2024-12-04 NOTE — Progress Notes (Addendum)
 Occupational Therapy Treatment Patient Details Name: Katie Leonard MRN: [id number] DOB: 1925/08/10 Today's Date: 12/04/2024   History of present illness 89 y/o F presenting to ED on 12/13 from [facility] after mechanical fall, with head and upper back/neck pain. Posterior head lac repaired with 5 staples in ED. CT head and C spien negative. CT T spine shows progression of nonacute thoracic vertebral body and posterior rib fxs since 2024. Ct chest with nondisplaced manubrial fx with anteromediastinal hematoma    PMH includes colon CA, glaucoma, insomnia, vertigo, HLD   OT comments  Pt progressing toward goals this session, remains oreinted to self but aware of having a fall. Pt needs min-mod A for ADLs, CGA for bed mobility, and min A for transfers with RW, pt with incr unsteadiness compared to prior evaluation, cues to keep RW close and noted continues to veer L. Pt also with 1 error on L side with letter cancellation task during session. Pt presenting with impairments listed below, will follow acutely. Patient will benefit from continued inpatient follow up therapy, <3 hours/day to maximize safety/ind with ADL/functional mobility.       If plan is discharge home, recommend the following:  A little help with walking and/or transfers;A lot of help with bathing/dressing/bathroom;Assistance with cooking/housework;Direct supervision/assist for medications management;Direct supervision/assist for financial management;Assist for transportation;Help with stairs or ramp for entrance   Equipment Recommendations  Other (comment) (defer)    Recommendations for Other Services PT consult    Precautions / Restrictions Precautions Precautions: Fall Precaution/Restrictions Comments: impulsive, veers L Restrictions Weight Bearing Restrictions Per Provider Order: No       Mobility Bed Mobility Overal bed mobility: Needs Assistance Bed Mobility: Supine to Sit, Sit to Supine     Supine to sit:  Contact guard Sit to supine: Contact guard assist        Transfers Overall transfer level: Needs assistance Equipment used: Rolling walker (2 wheels) Transfers: Sit to/from Stand Sit to Stand: Min assist                 Balance Overall balance assessment: Needs assistance Sitting-balance support: Feet supported Sitting balance-Leahy Scale: Good Sitting balance - Comments: reaches outside BOS without LOB     Standing balance-Leahy Scale: Poor Standing balance comment: reliant on RW support                           ADL either performed or assessed with clinical judgement   ADL Overall ADL's : Needs assistance/impaired     Grooming: Oral care;Minimal assistance;Standing;Wash/dry face;Wash/dry Programmer, Applications Details (indicate cue type and reason): for fine motor aspects of task             Lower Body Dressing: Moderate assistance;Sitting/lateral leans Lower Body Dressing Details (indicate cue type and reason): donning shoes Toilet Transfer: Minimal assistance;Ambulation;Rolling walker (2 wheels) Toilet Transfer Details (indicate cue type and reason): incr unsteadiness compared to previous dates, cues to keep RW close Toileting- Architect and Hygiene: Supervision/safety;Sitting/lateral lean       Functional mobility during ADLs: Minimal assistance;Rolling walker (2 wheels)      Extremity/Trunk Assessment Upper Extremity Assessment Upper Extremity Assessment: Generalized weakness   Lower Extremity Assessment Lower Extremity Assessment: Defer to PT evaluation        Vision   Additional Comments: drifts L, but able to locate grooming items on L side of sink with grooming task   Perception Perception Perception: Not tested  Praxis Praxis Praxis: Not tested   Communication Communication Communication: Impaired Factors Affecting Communication: Hearing impaired   Cognition Arousal: Alert Behavior During Therapy: WFL for tasks  assessed/performed Cognition: Cognition impaired   Orientation impairments: Time, Situation, Place Awareness: Online awareness impaired Memory impairment (select all impairments): Short-term memory Attention impairment (select first level of impairment): Sustained attention   OT - Cognition Comments: oriented to self, aware she fell, however unaware of working with therapy on previous dates                 Following commands: Impaired Following commands impaired: Follows one step commands with increased time      Cueing   Cueing Techniques: Verbal cues, Tactile cues  Exercises      Shoulder Instructions       General Comments VSS on RA    Pertinent Vitals/ Pain       Pain Assessment Pain Assessment: No/denies pain  Home Living                                          Prior Functioning/Environment              Frequency  Min 2X/week        Progress Toward Goals  OT Goals(current goals can now be found in the care plan section)  Progress towards OT goals: Progressing toward goals  Acute Rehab OT Goals Patient Stated Goal: none stated OT Goal Formulation: With patient Time For Goal Achievement: 12/16/24 Potential to Achieve Goals: Good ADL Goals Pt Will Perform Upper Body Dressing: with contact guard assist;sitting Pt Will Perform Lower Body Dressing: with contact guard assist;sitting/lateral leans;sit to/from stand Pt Will Transfer to Toilet: with contact guard assist;ambulating;regular height toilet Pt/caregiver will Perform Home Exercise Program: Increased ROM;Increased strength;Both right and left upper extremity;With written HEP provided;With minimal assist Additional ADL Goal #1: pt will follow 2 step command in prep for ADLs  Plan      Co-evaluation                 AM-PAC OT 6 Clicks Daily Activity     Outcome Measure   Help from another person eating meals?: A Little Help from another person taking care of  personal grooming?: A Little Help from another person toileting, which includes using toliet, bedpan, or urinal?: A Lot Help from another person bathing (including washing, rinsing, drying)?: A Lot Help from another person to put on and taking off regular upper body clothing?: A Lot Help from another person to put on and taking off regular lower body clothing?: A Lot 6 Click Score: 14    End of Session Equipment Utilized During Treatment: Gait belt;Rolling walker (2 wheels)  OT Visit Diagnosis: Unsteadiness on feet (R26.81);Other abnormalities of gait and mobility (R26.89);Muscle weakness (generalized) (M62.81)   Activity Tolerance Patient tolerated treatment well   Patient Left in bed;with call bell/phone within reach;with bed alarm set   Nurse Communication Mobility status; pt with bleeding gums during oral care        Time: 9073-9044 OT Time Calculation (min): 29 min  Charges: OT General Charges $OT Visit: 1 Visit OT Treatments $Self Care/Home Management : 23-37 mins  Kaizley Aja K, OTD, OTR/L SecureChat Preferred Acute Rehab [phone]   Bertel Venard K Koonce 12/04/2024, 10:13 AM

## 2024-12-06 ENCOUNTER — Emergency Department (HOSPITAL_BASED_OUTPATIENT_CLINIC_OR_DEPARTMENT_OTHER): Admission: EM | Admit: 2024-12-06 | Source: Home / Self Care

## 2024-12-06 ENCOUNTER — Other Ambulatory Visit: Payer: Self-pay

## 2024-12-06 ENCOUNTER — Emergency Department (HOSPITAL_BASED_OUTPATIENT_CLINIC_OR_DEPARTMENT_OTHER)

## 2024-12-06 DIAGNOSIS — W1830XA Fall on same level, unspecified, initial encounter: Secondary | ICD-10-CM | POA: Insufficient documentation

## 2024-12-06 DIAGNOSIS — Y92128 Other place in nursing home as the place of occurrence of the external cause: Secondary | ICD-10-CM | POA: Insufficient documentation

## 2024-12-06 DIAGNOSIS — S0990XA Unspecified injury of head, initial encounter: Secondary | ICD-10-CM | POA: Insufficient documentation

## 2024-12-06 DIAGNOSIS — Z4802 Encounter for removal of sutures: Secondary | ICD-10-CM | POA: Insufficient documentation

## 2024-12-06 DIAGNOSIS — W19XXXA Unspecified fall, initial encounter: Secondary | ICD-10-CM

## 2024-12-06 MED ORDER — ACETAMINOPHEN 500 MG PO TABS
1000.0000 mg | ORAL_TABLET | Freq: Once | ORAL | Status: AC
  Administered 2024-12-07: 500 mg via ORAL
  Filled 2024-12-06: qty 2

## 2024-12-06 NOTE — ED Provider Notes (Signed)
 [place] EMERGENCY DEPARTMENT AT [facility] Provider Note  CSN: [id number] Arrival date & time: 12/06/24 2158  Chief Complaint(s) Fall  HPI & MDM Katie Leonard is a 89 y.o. female here for {Add pertinent medical, surgical, social history, OB history to HPI:1}. History provided by ***.  Fall   ***   Medical Decision Making   Final Clinical Impression(s) / ED Diagnoses Final diagnoses:  None   ***  Past Medical History Past Medical History:  Diagnosis Date   Colon cancer (HCC)    Glaucoma    Hypertension    Insomnia    Patient Active Problem List   Diagnosis Date Noted   History of colon cancer 12/02/2024   Essential hypertension 12/02/2024   Hyperlipidemia 12/02/2024   Chronic vertigo 12/02/2024   Fall at home, initial encounter 12/02/2024   Closed fracture of manubrium 12/01/2024   Home Medication(s) Prior to Admission medications  Medication Sig Start Date End Date Taking? Authorizing Provider  Calcium  Carb-Cholecalciferol  (CALCIUM  600 + D PO) Take 1 tablet by mouth daily.    [provider]  CRANBERRY-VITAMIN C PO Take 1 capsule by mouth daily.    [provider]  meclizine  (ANTIVERT ) 12.5 MG tablet Take 0.5 tablets (6.25 mg total) by mouth 3 (three) times daily as needed for dizziness. 12/04/24   Lue Elsie BROCKS, MD  metoprolol  tartrate (LOPRESSOR ) 25 MG tablet Take 1 tablet (25 mg total) by mouth 2 (two) times daily. 12/04/24   Lue Elsie BROCKS, MD  mirtazapine (REMERON) 7.5 MG tablet Take 7.5 mg by mouth at bedtime.    [provider]  Multiple Vitamins-Minerals (CENTRUM SILVER 50+WOMEN) TABS Take 1 tablet by mouth daily.    [provider]  Multiple Vitamins-Minerals (PRESERVISION AREDS 2 PO) Take 2 capsules by mouth daily.    [provider]  nitroGLYCERIN (NITROSTAT) 0.3 MG SL tablet Place 0.3 mg under the tongue every 5 (five) minutes as needed for chest pain.    [provider]   polyethylene glycol (MIRALAX / GLYCOLAX) 17 g packet Take 17 g by mouth daily as needed for mild constipation, moderate constipation or severe constipation.    [provider]  sennosides-docusate sodium  (SENOKOT-S) 8.6-50 MG tablet Take 2 tablets by mouth daily.    [provider]  timolol  (BETIMOL ) 0.25 % ophthalmic solution Place 1 drop into both eyes 2 (two) times daily.    [provider]  travoprost , benzalkonium, (TRAVATAN ) 0.004 % ophthalmic solution Place 1 drop into both eyes at bedtime.    [provider]                                                                                                                                    Allergies Erythromycin, Other, and Pine  Review of Systems Review of Systems As noted in HPI  Physical Exam Vital Signs  I have reviewed the triage vital signs BP ROLLEN)  167/94   Pulse 78   Temp 97.8 F (36.6 C) (Oral)   Resp 18   SpO2 95%  *** Physical Exam  ED Results and Treatments Labs (all labs ordered are listed, but only abnormal results are displayed) Labs Reviewed - No data to display                                                                                                                       EKG  EKG Interpretation Date/Time:    Ventricular Rate:    PR Interval:    QRS Duration:    QT Interval:    QTC Calculation:   R Axis:      Text Interpretation:         Radiology CT Cervical Spine Wo Contrast Result Date: 12/06/2024 EXAM: CT CERVICAL SPINE WITHOUT CONTRAST 12/06/2024 10:24:53 PM TECHNIQUE: CT of the cervical spine was performed without the administration of intravenous contrast. Multiplanar reformatted images are provided for review. Automated exposure control, iterative reconstruction, and/or weight based adjustment of the mA/kV was utilized to reduce the radiation dose to as low as reasonably achievable. COMPARISON: 12/01/2024 CLINICAL HISTORY: Neck trauma (Age >= 65y)  FINDINGS: BONES AND ALIGNMENT: No acute fracture or traumatic malalignment. Unchanged anterolisthesis of C7. Unchanged superior endplate compression fracture of T4. DEGENERATIVE CHANGES: Unchanged multilevel spondylosis, disc space height loss, and facet arthropathy. Posterior disc osteophyte complex at C5-C6 and C6-C7 efface the ventral thecal sac greatest at C5-C6 where it is moderate. SOFT TISSUES: No prevertebral soft tissue swelling. IMPRESSION: 1. No acute cervical spine fracture or traumatic malalignment. Electronically signed by: Norman Gatlin MD 12/06/2024 10:36 PM EST RP Workstation: [id number]   CT Head Wo Contrast Result Date: 12/06/2024 EXAM: CT HEAD WITHOUT CONTRAST 12/06/2024 10:24:53 PM TECHNIQUE: CT of the head was performed without the administration of intravenous contrast. Automated exposure control, iterative reconstruction, and/or weight based adjustment of the mA/kV was utilized to reduce the radiation dose to as low as reasonably achievable. COMPARISON: 12/01/2024 CLINICAL HISTORY: Head trauma, minor (Age >= 65y). FINDINGS: BRAIN AND VENTRICLES: No acute hemorrhage. No evidence of acute infarct. Remote left parietal infarct. Patchy white matter hypodensities compatible with chronic microvascular disease. Cerebral atrophy. No hydrocephalus. No extra-axial collection. No mass effect or midline shift. Calcific atherosclerosis. ORBITS: No acute abnormality. SINUSES: Complete opacification of right maxillary sinus. SOFT TISSUES AND SKULL: Small posterior scalp contusion with overlying skin staples. No skull fracture. IMPRESSION: 1. No acute intracranial abnormality. Electronically signed by: Norman Gatlin MD 12/06/2024 10:32 PM EST RP Workstation: [id number]    Medications Ordered in ED Medications - No data to display Procedures Suture Removal  Date/Time: 12/06/2024 11:51 PM  Performed by: Trine Raynell Moder, MD Authorized by: Trine Raynell Moder, MD   Consent:     Consent obtained:  Verbal   Consent given by:  Patient   Risks discussed:  Bleeding, pain and wound separation Universal protocol:    Patient identity confirmed:  Verbally with patient Location:  Location:  Head/neck   Head/neck location:  Scalp Procedure details:    Wound appearance:  No signs of infection and clean   Number of staples removed:  5 Post-procedure details:    Post-removal:  No dressing applied   Procedure completion:  Tolerated   (including critical care time)   This chart was dictated using voice recognition software.  Despite best efforts to proofread,  errors can occur which can change the documentation meaning.

## 2024-12-06 NOTE — ED Triage Notes (Signed)
 Pt arrives from [facility] where she lives in assisted living.  Pt ambulated without a walker and fell back striking her head on the bathroom floor.  No LOC, no blood thinners. Pt is alert and oriented.  She has bruising to the back of her head as well as staples in place as she had a fall 12/13 for which she was seen here.

## 2024-12-06 NOTE — ED Notes (Signed)
 Patient transported to CT
